# Patient Record
Sex: Female | Born: 1985 | Race: Black or African American | Hispanic: No | Marital: Single | Smoking: Former smoker
Health system: Southern US, Community
[De-identification: ages and names within clinical notes are randomized; demographics above are authoritative.]

## PROBLEM LIST (undated history)

## (undated) DIAGNOSIS — Z8719 Personal history of other diseases of the digestive system: Secondary | ICD-10-CM

## (undated) DIAGNOSIS — N921 Excessive and frequent menstruation with irregular cycle: Secondary | ICD-10-CM

## (undated) DIAGNOSIS — K529 Noninfective gastroenteritis and colitis, unspecified: Secondary | ICD-10-CM

## (undated) DIAGNOSIS — G43909 Migraine, unspecified, not intractable, without status migrainosus: Secondary | ICD-10-CM

## (undated) DIAGNOSIS — F419 Anxiety disorder, unspecified: Secondary | ICD-10-CM

## (undated) DIAGNOSIS — A64 Unspecified sexually transmitted disease: Secondary | ICD-10-CM

## (undated) DIAGNOSIS — N840 Polyp of corpus uteri: Secondary | ICD-10-CM

## (undated) DIAGNOSIS — N870 Mild cervical dysplasia: Secondary | ICD-10-CM

## (undated) HISTORY — PX: COLPOSCOPY: SHX161

## (undated) HISTORY — DX: Mild cervical dysplasia: N87.0

## (undated) HISTORY — DX: Polyp of corpus uteri: N84.0

## (undated) HISTORY — DX: Migraine, unspecified, not intractable, without status migrainosus: G43.909

## (undated) HISTORY — DX: Excessive and frequent menstruation with irregular cycle: N92.1

## (undated) HISTORY — DX: Unspecified sexually transmitted disease: A64

---
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
## 2007-07-26 ENCOUNTER — Ambulatory Visit (HOSPITAL_BASED_OUTPATIENT_CLINIC_OR_DEPARTMENT_OTHER): Admission: RE | Admit: 2007-07-26 | Discharge: 2007-07-26 | Payer: Self-pay | Admitting: Ophthalmology

## 2008-10-20 DIAGNOSIS — Z8711 Personal history of peptic ulcer disease: Secondary | ICD-10-CM

## 2008-10-20 DIAGNOSIS — A64 Unspecified sexually transmitted disease: Secondary | ICD-10-CM

## 2008-10-20 HISTORY — DX: Unspecified sexually transmitted disease: A64

## 2008-10-20 HISTORY — DX: Personal history of peptic ulcer disease: Z87.11

## 2008-12-14 ENCOUNTER — Encounter: Payer: Self-pay | Admitting: Obstetrics and Gynecology

## 2008-12-14 ENCOUNTER — Other Ambulatory Visit: Admission: RE | Admit: 2008-12-14 | Discharge: 2008-12-14 | Payer: Self-pay | Admitting: Obstetrics and Gynecology

## 2008-12-14 ENCOUNTER — Ambulatory Visit: Payer: Self-pay | Admitting: Obstetrics and Gynecology

## 2008-12-14 DIAGNOSIS — N921 Excessive and frequent menstruation with irregular cycle: Secondary | ICD-10-CM

## 2008-12-14 HISTORY — DX: Excessive and frequent menstruation with irregular cycle: N92.1

## 2009-01-01 ENCOUNTER — Ambulatory Visit: Payer: Self-pay | Admitting: Obstetrics and Gynecology

## 2009-03-31 ENCOUNTER — Emergency Department (HOSPITAL_COMMUNITY): Admission: EM | Admit: 2009-03-31 | Discharge: 2009-04-01 | Payer: Self-pay | Admitting: Emergency Medicine

## 2009-08-16 DIAGNOSIS — N870 Mild cervical dysplasia: Secondary | ICD-10-CM

## 2009-08-16 HISTORY — DX: Mild cervical dysplasia: N87.0

## 2009-12-18 ENCOUNTER — Other Ambulatory Visit: Admission: RE | Admit: 2009-12-18 | Discharge: 2009-12-18 | Payer: Self-pay | Admitting: Obstetrics and Gynecology

## 2009-12-18 ENCOUNTER — Ambulatory Visit: Payer: Self-pay | Admitting: Obstetrics and Gynecology

## 2010-03-01 ENCOUNTER — Ambulatory Visit: Payer: Self-pay | Admitting: Women's Health

## 2010-03-29 ENCOUNTER — Ambulatory Visit: Payer: Self-pay | Admitting: Obstetrics and Gynecology

## 2010-04-17 ENCOUNTER — Ambulatory Visit: Payer: Self-pay | Admitting: Gynecology

## 2010-04-28 IMAGING — CR DG ABDOMEN ACUTE W/ 1V CHEST
3 series · 3 of 3 positions shown · non-contrast
Comparison: None

CLINICAL DATA: Abdominal pain

ACUTE ABDOMEN SERIES (ABDOMEN 2 VIEW & CHEST 1 VIEW)

[w chest pa]
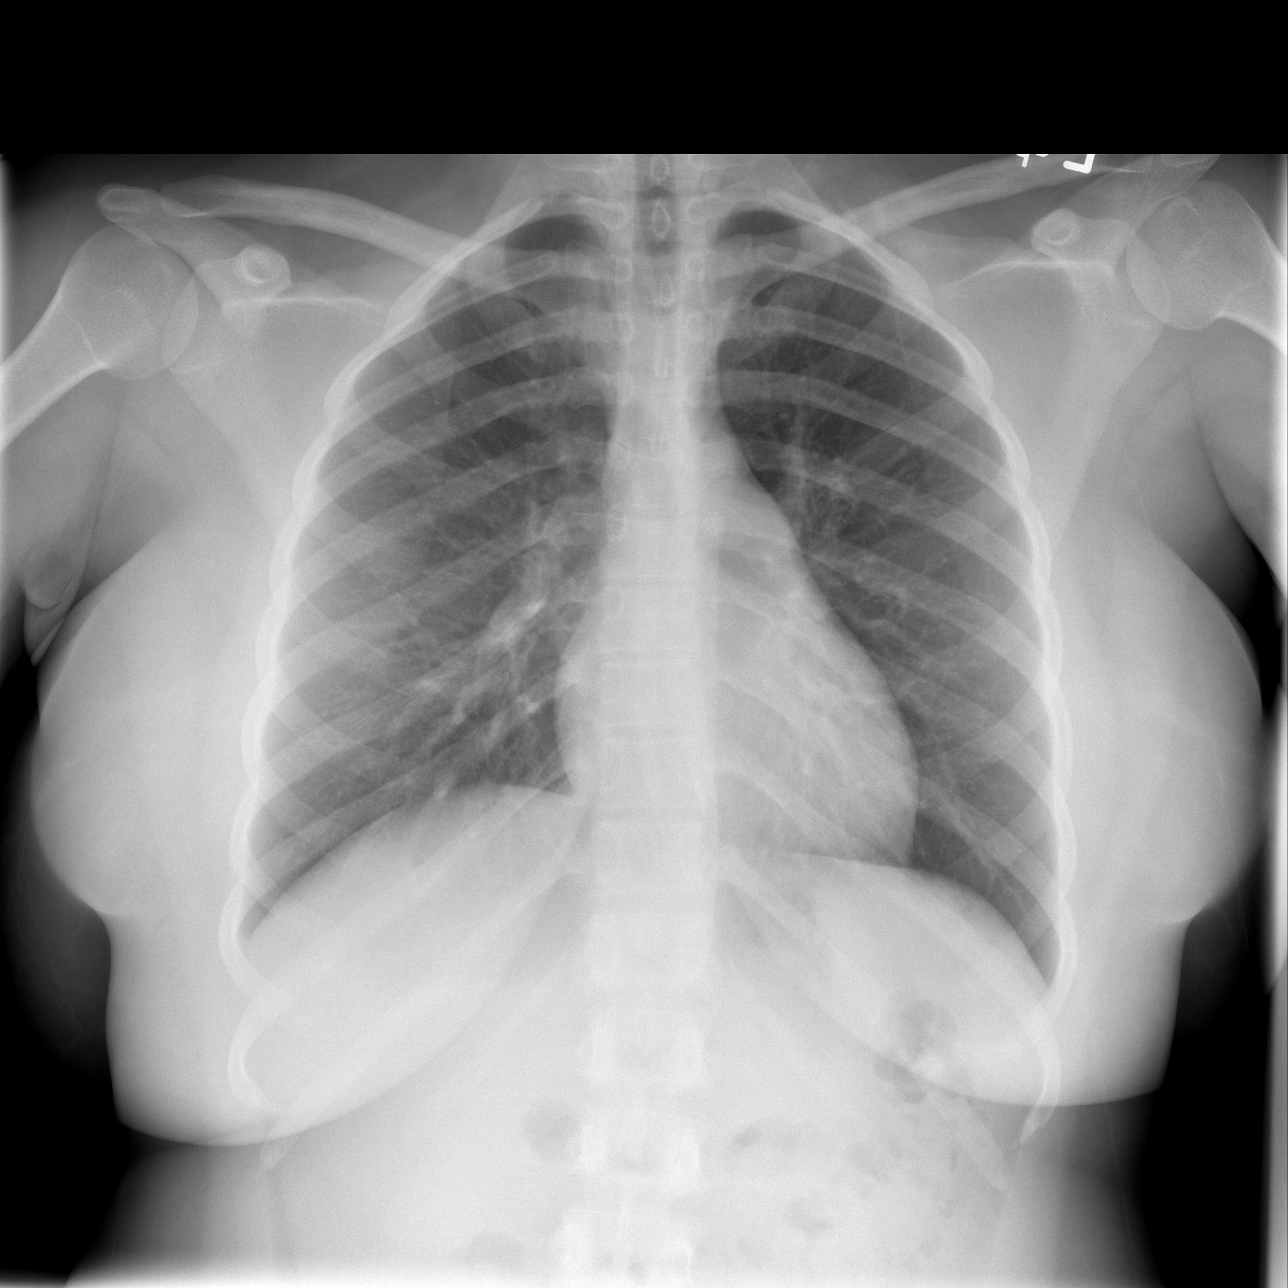

[w abdomen upright]
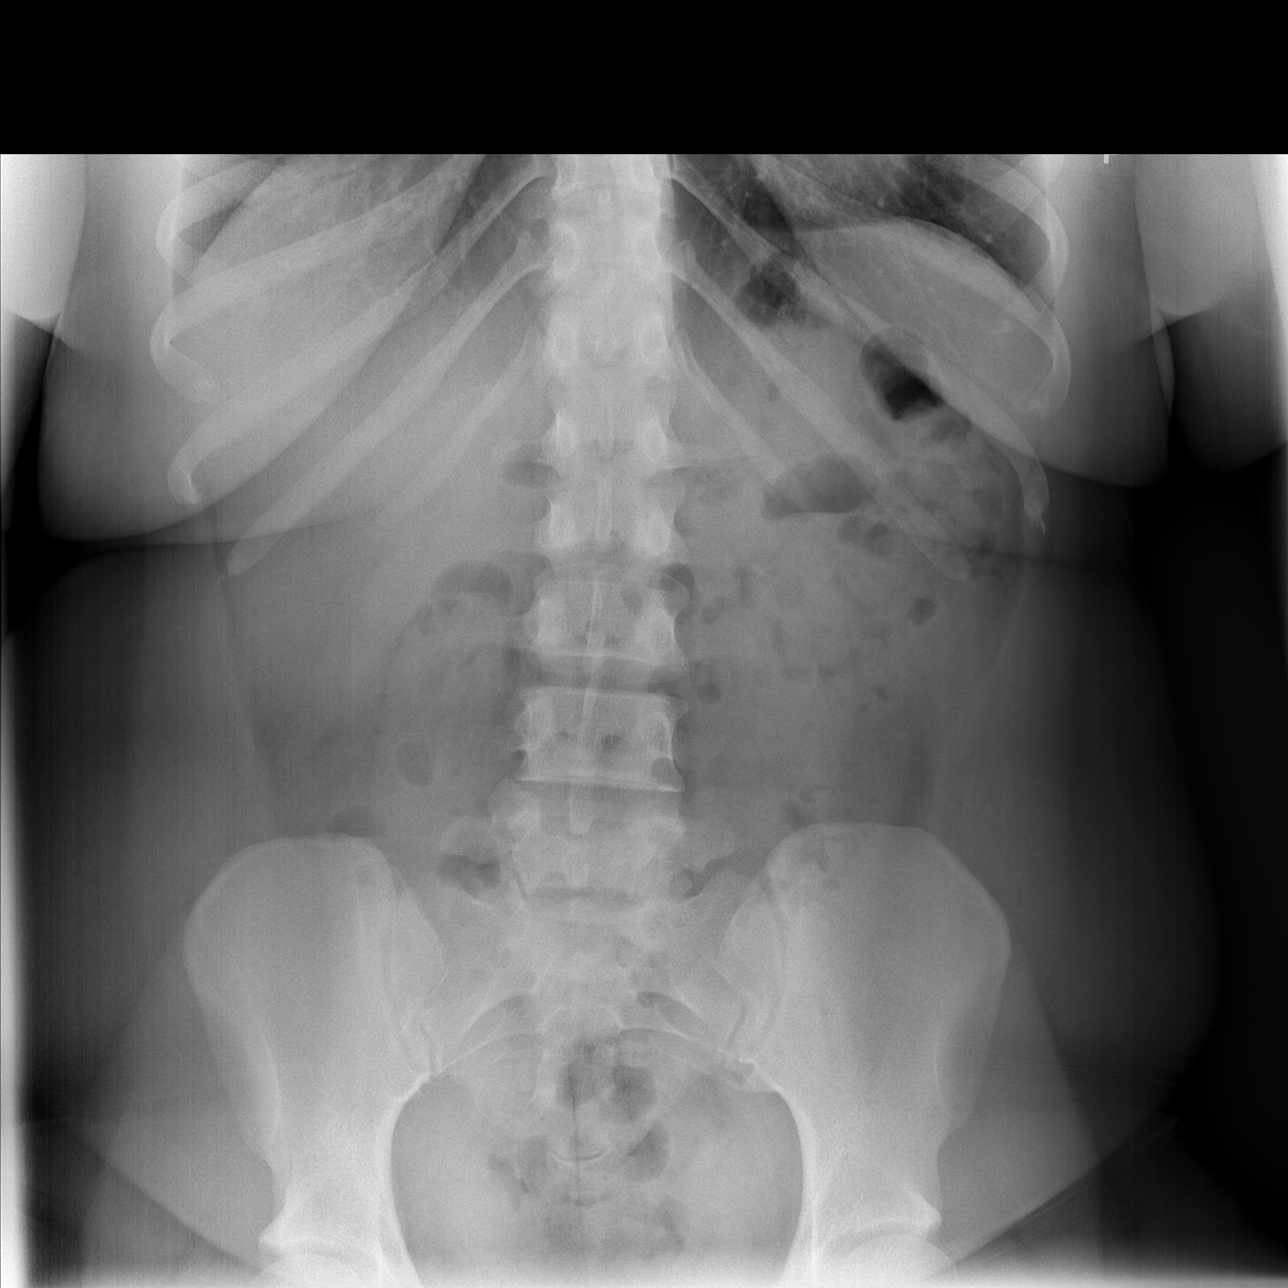

[t abdomen supine]
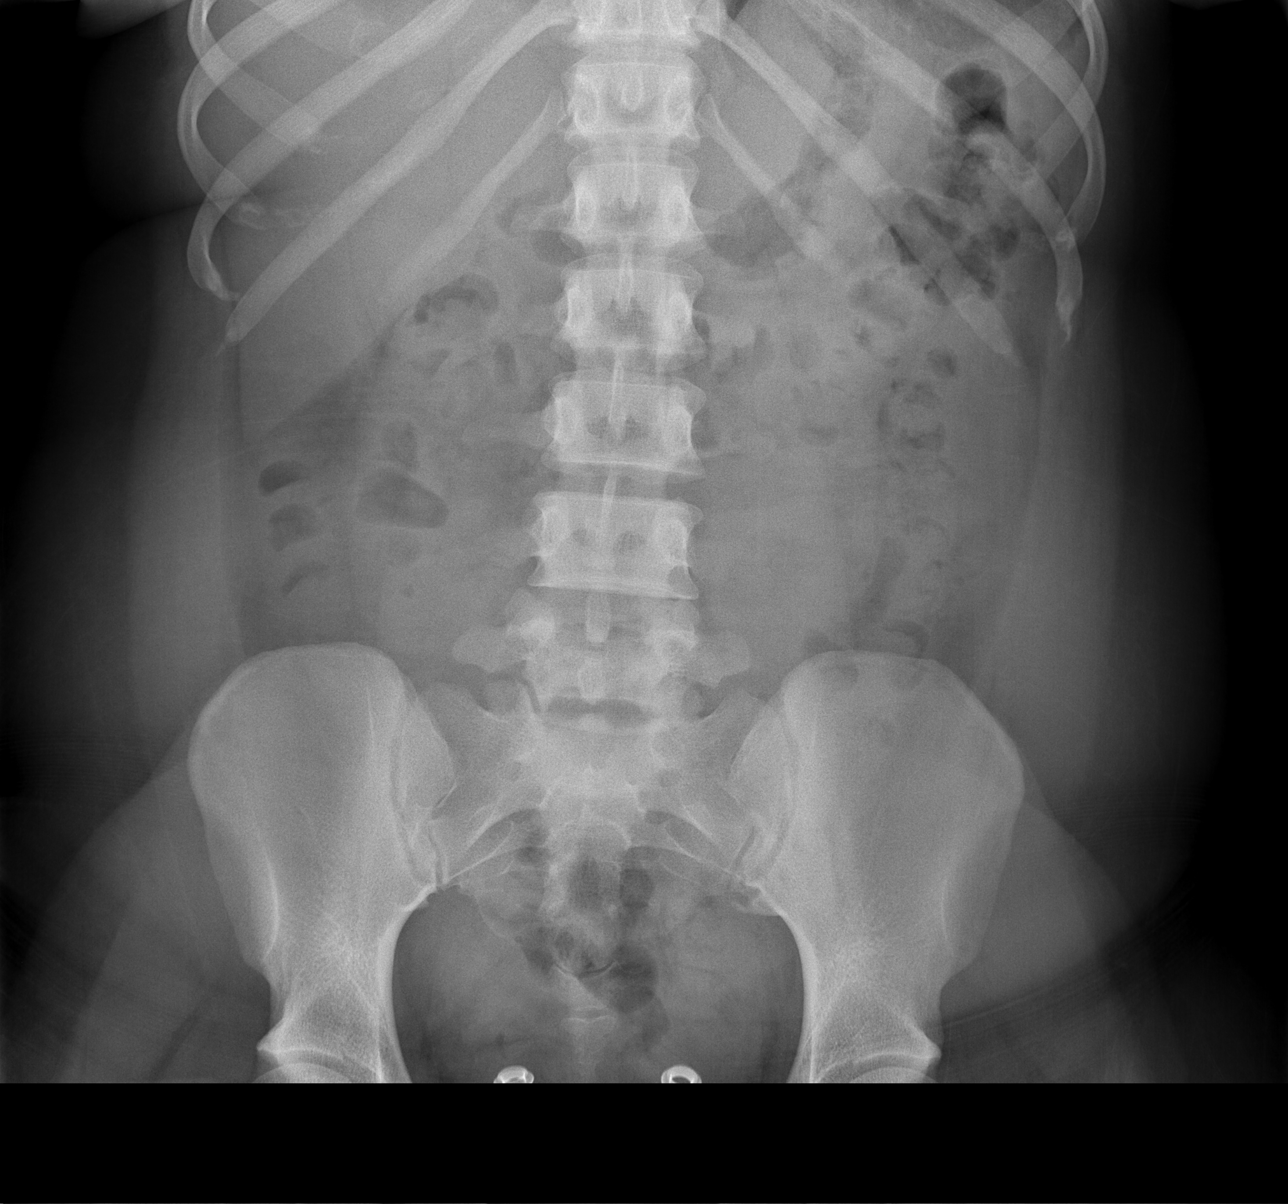

[3 of 3 positions shown; findings below may reference images not displayed]

FINDINGS: The heart size is normal.

There is no pleural effusion or pulmonary edema.

No airspace densities identified.

The bowel gas pattern appears unremarkable.

No dilated loops of small bowel or air-fluid levels noted.
IMPRESSION: 1.  Normal bowel gas pattern.

## 2010-11-06 ENCOUNTER — Emergency Department (HOSPITAL_COMMUNITY)
Admission: EM | Admit: 2010-11-06 | Discharge: 2010-11-06 | Payer: Self-pay | Source: Home / Self Care | Admitting: Emergency Medicine

## 2010-11-11 LAB — CBC
MCHC: 30.3 g/dL (ref 30.0–36.0)
MCV: 71.3 fL — ABNORMAL LOW (ref 78.0–100.0)
Platelets: 323 10*3/uL (ref 150–400)
RDW: 16.5 % — ABNORMAL HIGH (ref 11.5–15.5)
WBC: 12.6 10*3/uL — ABNORMAL HIGH (ref 4.0–10.5)

## 2010-11-11 LAB — COMPREHENSIVE METABOLIC PANEL
Albumin: 3.8 g/dL (ref 3.5–5.2)
Alkaline Phosphatase: 43 U/L (ref 39–117)
BUN: 8 mg/dL (ref 6–23)
Calcium: 9.3 mg/dL (ref 8.4–10.5)
Creatinine, Ser: 0.86 mg/dL (ref 0.4–1.2)
Potassium: 3.9 mEq/L (ref 3.5–5.1)
Total Protein: 7.5 g/dL (ref 6.0–8.3)

## 2010-11-11 LAB — URINALYSIS, ROUTINE W REFLEX MICROSCOPIC
Bilirubin Urine: NEGATIVE
Nitrite: NEGATIVE
Specific Gravity, Urine: 1.027 (ref 1.005–1.030)
pH: 5 (ref 5.0–8.0)

## 2010-11-11 LAB — DIFFERENTIAL
Basophils Relative: 1 % (ref 0–1)
Eosinophils Absolute: 0 10*3/uL (ref 0.0–0.7)
Monocytes Absolute: 0.4 10*3/uL (ref 0.1–1.0)
Neutrophils Relative %: 80 % — ABNORMAL HIGH (ref 43–77)

## 2010-11-11 LAB — URINE MICROSCOPIC-ADD ON

## 2011-01-27 LAB — COMPREHENSIVE METABOLIC PANEL
Alkaline Phosphatase: 53 U/L (ref 39–117)
BUN: 6 mg/dL (ref 6–23)
CO2: 28 mEq/L (ref 19–32)
Chloride: 104 mEq/L (ref 96–112)
Creatinine, Ser: 0.88 mg/dL (ref 0.4–1.2)
GFR calc non Af Amer: >60 mL/min (ref >60)
Glucose, Bld: 122 mg/dL — ABNORMAL HIGH (ref 70–99)
Potassium: 3.6 mEq/L (ref 3.5–5.1)
Total Bilirubin: 0.5 mg/dL (ref 0.3–1.2)

## 2011-01-27 LAB — DIFFERENTIAL
Basophils Absolute: 0 10*3/uL (ref 0.0–0.1)
Basophils Relative: 0 % (ref 0–1)
Lymphocytes Relative: 27 % (ref 12–46)
Neutro Abs: 4.2 10*3/uL (ref 1.7–7.7)

## 2011-01-27 LAB — CBC
HCT: 37.6 % (ref 36.0–46.0)
Hemoglobin: 12.2 g/dL (ref 12.0–15.0)
MCV: 80.2 fL (ref 78.0–100.0)
RBC: 4.68 MIL/uL (ref 3.87–5.11)
WBC: 6.7 10*3/uL (ref 4.0–10.5)

## 2011-01-27 LAB — URINALYSIS, ROUTINE W REFLEX MICROSCOPIC
Bilirubin Urine: NEGATIVE
Hgb urine dipstick: NEGATIVE
Nitrite: NEGATIVE
Protein, ur: NEGATIVE mg/dL
Specific Gravity, Urine: 1.013 (ref 1.005–1.030)
Urobilinogen, UA: 0.2 mg/dL (ref 0.0–1.0)

## 2011-01-27 LAB — LIPASE, BLOOD: Lipase: 32 U/L (ref 11–59)

## 2011-03-07 NOTE — Op Note (Signed)
[name], [name]            ACCOUNT NO.:  [id number]   MEDICAL RECORD NO.:  [id number]          PATIENT TYPE:  AMB   LOCATION:  DSC                          FACILITY:  [facility]   PHYSICIAN:  Salley Scarlet., M.D.DATE OF BIRTH:  01/29/1986   DATE OF PROCEDURE:  07/27/2007  DATE OF DISCHARGE:  07/26/2007                               OPERATIVE REPORT   PREOPERATIVE DIAGNOSIS:  Chalazion upper lid, left eye.   POSTOPERATIVE DIAGNOSIS:  Chalazion upper lid, left eye.   OPERATION:  Chalazion incision.   ANESTHESIA:  Local with Xylocaine 1%.   PROCEDURE:  The upper lid of the left eye was inspected and there was a  soft mass located along the lateral aspect of the upper lid of the left  eye.  The lid was then prepped and draped in the usual manner and then  infiltrated with several cc of Xylocaine 1%.  The patient was then  prepped and draped in the usual manner.  Chalazian clamp was applied  over the lesion and the lid was everted.  A cruciate incision was made  proximal in the lesion and the lesion was curetted using the chalazian  curette.  The sac was incised and sterilized using a sharp and blunt  dissection.  Polysporin ophthalmic ointment and a pressure patch was  applied.  The patient tolerated the procedure well.   Discharged in satisfactory condition with instructions to take Darvocet-  N 100 q.4h. as needed for pain and see me in the office tomorrow for  further evaluation.   DISCHARGE DIAGNOSIS:  Chalazion upper lid left eye      Salley Scarlet., M.D.  Electronically Signed     TB/MEDQ  D:  07/27/2007  T:  07/27/2007  Job:  [id number]

## 2011-04-08 ENCOUNTER — Other Ambulatory Visit: Payer: Self-pay | Admitting: Obstetrics and Gynecology

## 2011-04-08 ENCOUNTER — Other Ambulatory Visit (HOSPITAL_COMMUNITY)
Admission: RE | Admit: 2011-04-08 | Discharge: 2011-04-08 | Disposition: A | Discharge status: Home / Self Care | Payer: Federal, State, Local not specified - PPO | Source: Ambulatory Visit | Attending: Obstetrics and Gynecology | Admitting: Obstetrics and Gynecology

## 2011-04-08 ENCOUNTER — Encounter (INDEPENDENT_AMBULATORY_CARE_PROVIDER_SITE_OTHER): Payer: Federal, State, Local not specified - PPO | Admitting: Obstetrics and Gynecology

## 2011-04-08 DIAGNOSIS — R823 Hemoglobinuria: Secondary | ICD-10-CM

## 2011-04-08 DIAGNOSIS — Z124 Encounter for screening for malignant neoplasm of cervix: Secondary | ICD-10-CM | POA: Insufficient documentation

## 2011-04-08 DIAGNOSIS — N949 Unspecified condition associated with female genital organs and menstrual cycle: Secondary | ICD-10-CM

## 2011-04-08 DIAGNOSIS — Z01419 Encounter for gynecological examination (general) (routine) without abnormal findings: Secondary | ICD-10-CM

## 2011-04-14 ENCOUNTER — Encounter: Payer: Self-pay | Admitting: Obstetrics and Gynecology

## 2011-05-13 ENCOUNTER — Telehealth: Payer: Self-pay | Admitting: *Deleted

## 2011-05-13 NOTE — Telephone Encounter (Signed)
Patient called c/o crying for the past 3 days. She saw ou on 6/19 given Megace to take to stop bleeding. She took it twice a day then increased to three times a day to stop bleeding. Once she stopped Megace she began a period on 05/02/11. She took Clomid 100mg  D5-9 finished that 3 days ago. That's when she has started to cry throughout the day for no apparent reason. She cries all the time. Do you think its hormonal from going from Megace to Clomid? Or is it just a reaction to Clomid? Please advise

## 2011-05-14 NOTE — Telephone Encounter (Signed)
I think her mood swings due to the Clomid not the Megace. She needs to inform os when she starts her period. We then me to see if the Clomid is working. Once solution would be if she's currently to emotional would be to switch her to Femara. A lot of women will not have the same mood swings with Femera that they have with Clomid.

## 2011-05-14 NOTE — Telephone Encounter (Signed)
LM FOR PT TO RETURN MY CALL. KW 4:04 PM 05/14/11

## 2011-05-14 NOTE — Telephone Encounter (Signed)
Patient informed of Dr Timoteo Expose note and patient understood. Will call with or without period. Sherrilyn Rist

## 2011-06-11 ENCOUNTER — Emergency Department (HOSPITAL_COMMUNITY)
Admission: EM | Admit: 2011-06-11 | Discharge: 2011-06-11 | Disposition: A | Discharge status: Home / Self Care | Payer: Federal, State, Local not specified - PPO | Attending: Emergency Medicine | Admitting: Emergency Medicine

## 2011-06-11 DIAGNOSIS — R059 Cough, unspecified: Secondary | ICD-10-CM | POA: Insufficient documentation

## 2011-06-11 DIAGNOSIS — R05 Cough: Secondary | ICD-10-CM | POA: Insufficient documentation

## 2011-08-11 ENCOUNTER — Encounter: Payer: Self-pay | Admitting: Gynecology

## 2011-08-11 ENCOUNTER — Ambulatory Visit (INDEPENDENT_AMBULATORY_CARE_PROVIDER_SITE_OTHER): Payer: Federal, State, Local not specified - PPO | Admitting: Gynecology

## 2011-08-11 VITALS — BP 124/76

## 2011-08-11 DIAGNOSIS — R102 Pelvic and perineal pain: Secondary | ICD-10-CM

## 2011-08-11 DIAGNOSIS — N926 Irregular menstruation, unspecified: Secondary | ICD-10-CM

## 2011-08-11 DIAGNOSIS — N949 Unspecified condition associated with female genital organs and menstrual cycle: Secondary | ICD-10-CM

## 2011-08-11 MED ORDER — MEGESTROL ACETATE 20 MG PO TABS: 20.0000 mg | ORAL_TABLET | Freq: Every day | ORAL | Status: AC

## 2011-08-11 MED ORDER — IBUPROFEN 800 MG PO TABS: 800.0000 mg | ORAL_TABLET | Freq: Three times a day (TID) | ORAL | Status: AC | PRN

## 2011-08-11 NOTE — Progress Notes (Signed)
Patient presents complaining of irregular bleeding and pelvic pain. She is a long history of irregular bleeding attributed to ovulatory dysfunction. She actually was treated with a course of Clomid this past summer per Dr. Eda Paschal but continued to bleed on and off irregularly since then. She had been on oral contraceptives before this but said that this didn't seem to regulate her bleeding. She woke of the last several days with increasing pelvic cramping which led to her office visit today. She had been treated with Megace in the past which stopped her bleeding. She has no history of ultrasound studies both in the chart or per her history.  Exam Spine straight no CVA tenderness Abdomen soft with mild suprapubic tenderness no masses guarding rebound organomegaly Pelvic external BUS vagina with menses type flow, cervix normal, uterus grossly normal in size midline mild tenderness adnexa without masses or tenderness  Assessment and plan: Irregular menses pelvic cramping. Will check a qualitative hCG prolactin TSH. Start on Megace 20 mg twice a day times several days to control bleeding then daily until sonohysterogram rule out nonpalpable abnormalities such as polyps submucous myomas. Ibuprofen 800 mg 3 times a day when necessary pain #30 refill x1.  Follow up for ultrasound and lab work.

## 2011-08-27 ENCOUNTER — Ambulatory Visit: Payer: Federal, State, Local not specified - PPO | Admitting: Gynecology

## 2011-08-27 ENCOUNTER — Other Ambulatory Visit: Payer: Federal, State, Local not specified - PPO

## 2011-10-21 HISTORY — PX: COMBINED HYSTEROSCOPY DIAGNOSTIC / D&C: SUR297

## 2012-07-01 ENCOUNTER — Encounter: Payer: Self-pay | Admitting: Obstetrics and Gynecology

## 2012-07-01 ENCOUNTER — Other Ambulatory Visit (HOSPITAL_COMMUNITY)
Admission: RE | Admit: 2012-07-01 | Discharge: 2012-07-01 | Disposition: A | Discharge status: Home / Self Care | Payer: Federal, State, Local not specified - PPO | Source: Ambulatory Visit | Attending: Obstetrics and Gynecology | Admitting: Obstetrics and Gynecology

## 2012-07-01 ENCOUNTER — Ambulatory Visit (INDEPENDENT_AMBULATORY_CARE_PROVIDER_SITE_OTHER): Payer: Federal, State, Local not specified - PPO | Admitting: Obstetrics and Gynecology

## 2012-07-01 VITALS — BP 124/78 | Ht 63.0 in | Wt 191.0 lb

## 2012-07-01 DIAGNOSIS — J45909 Unspecified asthma, uncomplicated: Secondary | ICD-10-CM | POA: Insufficient documentation

## 2012-07-01 DIAGNOSIS — Z01419 Encounter for gynecological examination (general) (routine) without abnormal findings: Secondary | ICD-10-CM | POA: Insufficient documentation

## 2012-07-01 DIAGNOSIS — N949 Unspecified condition associated with female genital organs and menstrual cycle: Secondary | ICD-10-CM

## 2012-07-01 DIAGNOSIS — N870 Mild cervical dysplasia: Secondary | ICD-10-CM

## 2012-07-01 DIAGNOSIS — G43909 Migraine, unspecified, not intractable, without status migrainosus: Secondary | ICD-10-CM | POA: Insufficient documentation

## 2012-07-01 DIAGNOSIS — N938 Other specified abnormal uterine and vaginal bleeding: Secondary | ICD-10-CM

## 2012-07-01 LAB — CBC WITH DIFFERENTIAL/PLATELET
Basophils Absolute: 0.1 10*3/uL (ref 0.0–0.1)
Eosinophils Relative: 3 % (ref 0–5)
Lymphocytes Relative: 49 % — ABNORMAL HIGH (ref 12–46)
Lymphs Abs: 2.4 10*3/uL (ref 0.7–4.0)
Neutro Abs: 1.9 10*3/uL (ref 1.7–7.7)
Neutrophils Relative %: 39 % — ABNORMAL LOW (ref 43–77)
Platelets: 326 10*3/uL (ref 150–400)
RBC: 4.94 MIL/uL (ref 3.87–5.11)
RDW: 16.7 % — ABNORMAL HIGH (ref 11.5–15.5)
WBC: 4.8 10*3/uL (ref 4.0–10.5)

## 2012-07-01 MED ORDER — MEGESTROL ACETATE 40 MG PO TABS: 40.0000 mg | ORAL_TABLET | Freq: Two times a day (BID) | ORAL | Status: DC

## 2012-07-01 NOTE — Addendum Note (Signed)
Addended by: Dayna Barker on: 07/01/2012 11:36 AM   Modules accepted: Orders

## 2012-07-01 NOTE — Patient Instructions (Signed)
Schedule saline infusion histogram. 

## 2012-07-01 NOTE — Progress Notes (Signed)
Patient came to see me today both for annual exam and problem visit. She continues with metromenorrhagia. She has not done well with birth control pills. She was scheduled for saline infusion histogram but did not keep appointment. She has been anemic with a hemoglobin of 10. Her thyroid and prolactin levels were normal. She is not sexually active at the moment. 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 She had a normal Pap smear after the above in June, 2012. We have previously tried to get her approved for a Mirena IUD. At that time her insurance would not cover it. She has actually been bleeding almost daily since February.  Physical examination:Erika Osborne present. HEENT within normal limits. Neck: Thyroid not large. No masses. Supraclavicular nodes: not enlarged. Breasts: Examined in both sitting and lying  position. No skin changes and no masses. Abdomen: Soft no guarding rebound or masses or hernia. Pelvic: External: Within normal limits. BUS: Within normal limits. Vaginal:within normal limits. Good estrogen effect. No evidence of cystocele rectocele or enterocele. Cervix: clean. Uterus: Normal size and shape. Adnexa: No masses. Rectovaginal exam: Confirmatory and negative. Extremities: Within normal limits.  Assessment: #1. Dysfunctional uterine bleeding #2. CIN-1 #3. Anemia  Plan: CBC drawn. SIH scheduled. Megace 40 mg twice a day until bleeding stops then daily tell we due SIH. We will get her approved for Mirena IUD.

## 2012-07-02 ENCOUNTER — Telehealth: Payer: Self-pay | Admitting: Obstetrics and Gynecology

## 2012-07-02 ENCOUNTER — Other Ambulatory Visit: Payer: Self-pay | Admitting: Obstetrics and Gynecology

## 2012-07-02 DIAGNOSIS — Z3049 Encounter for surveillance of other contraceptives: Secondary | ICD-10-CM

## 2012-07-02 LAB — URINALYSIS W MICROSCOPIC + REFLEX CULTURE
Bacteria, UA: NONE SEEN
Casts: NONE SEEN
Glucose, UA: NEGATIVE mg/dL
Ketones, ur: NEGATIVE mg/dL
Leukocytes, UA: NEGATIVE
Protein, ur: NEGATIVE mg/dL
pH: 5 (ref 5.0–8.0)

## 2012-07-02 MED ORDER — LEVONORGESTREL 20 MCG/24HR IU IUD: INTRAUTERINE_SYSTEM | Freq: Once | INTRAUTERINE | Status: DC

## 2012-07-02 NOTE — Telephone Encounter (Signed)
Message copied by Keenan Bachelor on Fri Jul 02, 2012 10:46 AM ------      Message from: Trellis Paganini      Created: Fri Jul 02, 2012  7:58 AM       Tell patient that she is slightly anemic due to periods. Take ferrous sulfate 325 mg daily ( it is over the counter).

## 2012-07-02 NOTE — Telephone Encounter (Signed)
Pt advised today that Mirena & insertion covered under her$20.00 copay per [employer],not going towards her deductible. We will  Order one for her and she will call to schedule insertion/WL

## 2012-07-02 NOTE — Telephone Encounter (Signed)
I called patient about lab results/recommendation.    Patient wants to have IUD inserted.  However, she said her menses very irregular. She said you gave her RX to stop it for [facility] next week.  She said sometimes she has bleeding for 6 mos and sometimes she goes without menses x 6 mos.  She was told we insert it with her menses and she wondered how she will ever schedule it.  She said she is not sexually active.   Patient knows Dr. Reece Agar is off and I will call her next week.

## 2012-07-03 NOTE — Telephone Encounter (Signed)
After we do shgm we will stop megace. That will start cycle and we can insert IUD. Did we get her  approved?

## 2012-07-05 NOTE — Telephone Encounter (Signed)
We don't actually get them "approved". We have checked her benefits to verify it is covered and she has been notified of the benefit and wants to proceed.  I will let her know the plan.

## 2012-07-05 NOTE — Telephone Encounter (Signed)
Patient informed. 

## 2012-07-08 ENCOUNTER — Ambulatory Visit (INDEPENDENT_AMBULATORY_CARE_PROVIDER_SITE_OTHER): Payer: Federal, State, Local not specified - PPO

## 2012-07-08 ENCOUNTER — Encounter: Payer: Self-pay | Admitting: Obstetrics and Gynecology

## 2012-07-08 ENCOUNTER — Other Ambulatory Visit: Payer: Self-pay | Admitting: Obstetrics and Gynecology

## 2012-07-08 ENCOUNTER — Ambulatory Visit (INDEPENDENT_AMBULATORY_CARE_PROVIDER_SITE_OTHER): Payer: Federal, State, Local not specified - PPO | Admitting: Obstetrics and Gynecology

## 2012-07-08 DIAGNOSIS — N8300 Follicular cyst of ovary, unspecified side: Secondary | ICD-10-CM

## 2012-07-08 DIAGNOSIS — E282 Polycystic ovarian syndrome: Secondary | ICD-10-CM

## 2012-07-08 DIAGNOSIS — N926 Irregular menstruation, unspecified: Secondary | ICD-10-CM

## 2012-07-08 DIAGNOSIS — N921 Excessive and frequent menstruation with irregular cycle: Secondary | ICD-10-CM

## 2012-07-08 DIAGNOSIS — N938 Other specified abnormal uterine and vaginal bleeding: Secondary | ICD-10-CM

## 2012-07-08 DIAGNOSIS — N949 Unspecified condition associated with female genital organs and menstrual cycle: Secondary | ICD-10-CM

## 2012-07-08 DIAGNOSIS — N920 Excessive and frequent menstruation with regular cycle: Secondary | ICD-10-CM

## 2012-07-08 DIAGNOSIS — R102 Pelvic and perineal pain: Secondary | ICD-10-CM

## 2012-07-08 DIAGNOSIS — N840 Polyp of corpus uteri: Secondary | ICD-10-CM

## 2012-07-08 NOTE — Patient Instructions (Signed)
Erika Osborne will call you with dates. Stay on Megace twice a day until surgery.

## 2012-07-08 NOTE — Progress Notes (Signed)
Patient came back to see me today for a saline infusion histogram. On ultrasound she has an anteverted uterus with a prominent endometrial cavity. The endometrial echo is 11.3 mm. Her ovaries were free of neoplasms but she does have a pearl necklace appearance on both sides consistent with polycystic ovaries. Saline was introduced into the endometrial cavity through a catheter and multiple endometrial polyps were identified. There appeared to be approximately 5.  Patient was given the findings including endometrial polyps and polycystic ovaries. She will stay on Megace and we will do a hysteroscopy D&C. We discussed pros and cons of also placing a Jearld Adjutant IUD at the same time. She will decide and inform. She is currently not sexually active. Stay on Megace 40 mg twice a day until surgery.

## 2012-07-08 NOTE — H&P (Signed)
  Chief complaint: Menorrhagia with endometrial polyps. History of present illness: Patient is a 26 year old nulligravida with persistent metromenorrhagia associated with anemia. Saline infusion histogram done in the office showed multiple endometrial polyps. She now enters the hospital for hysteroscopy and excision of endometrial polyps.  Past medical history, family history, social history, review of systems in epic record and reviewed.  Physical examination: HEENT within normal limits. Neck: Thyroid not large. No masses. Supraclavicular nodes: not enlarged. Breasts: Examined in both sitting and lying  position. No skin changes and no masses. Abdomen: Soft no guarding rebound or masses or hernia. Pelvic: External: Within normal limits. BUS: Within normal limits. Vaginal:within normal limits. Good estrogen effect. No evidence of cystocele rectocele or enterocele. Cervix: clean. Uterus: Normal size and shape. Adnexa: No masses. Rectovaginal exam: Confirmatory and negative. Extremities: Within normal limits.  Assessment menometrorrhagia  Plan: Hysteroscopy with excision of endometrial polyps.

## 2012-07-09 ENCOUNTER — Telehealth: Payer: Self-pay | Admitting: Obstetrics and Gynecology

## 2012-07-09 ENCOUNTER — Encounter (HOSPITAL_COMMUNITY): Payer: Self-pay | Admitting: Pharmacist

## 2012-07-09 NOTE — Telephone Encounter (Signed)
I called patient this am to let her know I needed to move her surgery scheduled for 9/27 7:30am to [facility] (from [facility]).  Patient will expect call from [facility] to set up her pre-op appt and provide instructions.  Patient wanted to let me know that she has decided that she does not want Mirena IUD at this time.

## 2012-07-13 NOTE — H&P (Signed)
  Chief complaint: Metromenorrhagia with endometrial polyps  History of present illness: Patient is a 26 year old nulligravida with persistent metromenorrhagia over the past year. She has not responded to oral contraceptives or Megace. Saline infusion histogram showed multiple endometrial polyps. Patient now enters the hospital for hysteroscopy with removal of all endometrial polyps.  Past medical history, family history, social history, and review of systems in epic record and reviewed.  Physical examination: HEENT within normal limits. Neck: Thyroid not large. No masses. Supraclavicular nodes: not enlarged. Breasts: Examined in both sitting and lying  position. No skin changes and no masses. Abdomen: Soft no guarding rebound or masses or hernia. Pelvic: External: Within normal limits. BUS: Within normal limits. Vaginal:within normal limits. Good estrogen effect. No evidence of cystocele rectocele or enterocele. Cervix: clean. Uterus: Normal size and shape. Adnexa: No masses. Rectovaginal exam: Confirmatory and negative. Extremities: Within normal limits.  Assessment: #1. Menometrorrhagia #2. Endometrial polyps  Plan: Hysteroscopy, D&C with removal of endometrial polyps.

## 2012-07-14 ENCOUNTER — Encounter (HOSPITAL_COMMUNITY)
Admission: RE | Admit: 2012-07-14 | Discharge: 2012-07-14 | Disposition: A | Discharge status: Home / Self Care | Payer: Federal, State, Local not specified - PPO | Source: Ambulatory Visit | Attending: Obstetrics and Gynecology | Admitting: Obstetrics and Gynecology

## 2012-07-14 ENCOUNTER — Encounter (HOSPITAL_COMMUNITY): Payer: Self-pay

## 2012-07-14 LAB — CBC
HCT: 36.6 % (ref 36.0–46.0)
Hemoglobin: 11.6 g/dL — ABNORMAL LOW (ref 12.0–15.0)
MCV: 72.6 fL — ABNORMAL LOW (ref 78.0–100.0)
WBC: 5.8 10*3/uL (ref 4.0–10.5)

## 2012-07-14 NOTE — Patient Instructions (Addendum)
   Your procedure is scheduled on: Friday September 27th  Enter through the Main Entrance of [facility] at: [employer] up the phone at the desk and dial [phone] and inform us of your arrival.  Please call this number if you have any problems the morning of surgery: [phone]  Remember: Do not eat or drink anything after midnight on Thursday You may take your topamax morning of surgery with a sip of water and bring your inhalers with you.  Do not wear jewelry, make-up, or FINGER nail polish No metal in your hair or on your body. Do not wear lotions, powders, perfumes. You may wear deodorant.  Please use your CHG wash as directed prior to surgery.  Do not shave anywhere for at least 12 hours prior to first CHG shower.  Do not bring valuables to the hospital. Contacts may not be worn into surgery.    Patients discharged on the day of surgery will not be allowed to drive home.

## 2012-07-16 ENCOUNTER — Encounter (HOSPITAL_COMMUNITY): Payer: Self-pay | Admitting: Anesthesiology

## 2012-07-16 ENCOUNTER — Encounter (HOSPITAL_COMMUNITY): Payer: Self-pay | Admitting: *Deleted

## 2012-07-16 ENCOUNTER — Ambulatory Visit (HOSPITAL_BASED_OUTPATIENT_CLINIC_OR_DEPARTMENT_OTHER)
Admission: RE | Admit: 2012-07-16 | Discharge: 2012-07-16 | Disposition: A | Discharge status: Home / Self Care | Payer: Federal, State, Local not specified - PPO | Source: Ambulatory Visit | Attending: Obstetrics and Gynecology | Admitting: Obstetrics and Gynecology

## 2012-07-16 ENCOUNTER — Ambulatory Visit (HOSPITAL_COMMUNITY): Payer: Federal, State, Local not specified - PPO | Admitting: Anesthesiology

## 2012-07-16 ENCOUNTER — Encounter (HOSPITAL_COMMUNITY)
Admission: RE | Disposition: A | Discharge status: Home / Self Care | Payer: Self-pay | Source: Ambulatory Visit | Attending: Obstetrics and Gynecology

## 2012-07-16 DIAGNOSIS — D250 Submucous leiomyoma of uterus: Secondary | ICD-10-CM | POA: Insufficient documentation

## 2012-07-16 DIAGNOSIS — N920 Excessive and frequent menstruation with regular cycle: Secondary | ICD-10-CM | POA: Insufficient documentation

## 2012-07-16 DIAGNOSIS — Z01812 Encounter for preprocedural laboratory examination: Secondary | ICD-10-CM | POA: Insufficient documentation

## 2012-07-16 DIAGNOSIS — D219 Benign neoplasm of connective and other soft tissue, unspecified: Secondary | ICD-10-CM

## 2012-07-16 DIAGNOSIS — N840 Polyp of corpus uteri: Secondary | ICD-10-CM | POA: Insufficient documentation

## 2012-07-16 DIAGNOSIS — N921 Excessive and frequent menstruation with irregular cycle: Secondary | ICD-10-CM

## 2012-07-16 DIAGNOSIS — Z01818 Encounter for other preprocedural examination: Secondary | ICD-10-CM | POA: Insufficient documentation

## 2012-07-16 SURGERY — Surgical Case
Anesthesia: *Unknown

## 2012-07-16 SURGERY — DILATATION & CURETTAGE/HYSTEROSCOPY WITH RESECTOCOPE
Anesthesia: General | Site: Uterus | Wound class: Clean

## 2012-07-16 MED ORDER — PROPOFOL 10 MG/ML IV BOLUS
INTRAVENOUS | Status: DC | PRN
  Administered 2012-07-16: 200 mg via INTRAVENOUS

## 2012-07-16 MED ORDER — LIDOCAINE HCL (CARDIAC) 20 MG/ML IV SOLN
INTRAVENOUS | Status: AC
  Filled 2012-07-16: qty 5

## 2012-07-16 MED ORDER — ONDANSETRON HCL 4 MG/2ML IJ SOLN
INTRAMUSCULAR | Status: DC | PRN
  Administered 2012-07-16: 4 mg via INTRAVENOUS

## 2012-07-16 MED ORDER — LACTATED RINGERS IV SOLN: 500.0000 mL | INTRAVENOUS | Status: DC

## 2012-07-16 MED ORDER — FENTANYL CITRATE 0.05 MG/ML IJ SOLN
INTRAMUSCULAR | Status: DC | PRN
  Administered 2012-07-16 (×2): 50 ug via INTRAVENOUS

## 2012-07-16 MED ORDER — FENTANYL CITRATE 0.05 MG/ML IJ SOLN
INTRAMUSCULAR | Status: AC
  Filled 2012-07-16: qty 2

## 2012-07-16 MED ORDER — ALBUTEROL SULFATE HFA 108 (90 BASE) MCG/ACT IN AERS
INHALATION_SPRAY | RESPIRATORY_TRACT | Status: DC | PRN
  Administered 2012-07-16: 1 via RESPIRATORY_TRACT

## 2012-07-16 MED ORDER — CEFAZOLIN SODIUM-DEXTROSE 2-3 GM-% IV SOLR
INTRAVENOUS | Status: AC
  Administered 2012-07-16: 2 g via INTRAVENOUS
  Filled 2012-07-16: qty 50

## 2012-07-16 MED ORDER — LIDOCAINE HCL (CARDIAC) 20 MG/ML IV SOLN
INTRAVENOUS | Status: DC | PRN
  Administered 2012-07-16: 80 mg via INTRAVENOUS

## 2012-07-16 MED ORDER — PROPOFOL 10 MG/ML IV EMUL
INTRAVENOUS | Status: AC
  Filled 2012-07-16: qty 20

## 2012-07-16 MED ORDER — ONDANSETRON HCL 4 MG/2ML IJ SOLN: 4.0000 mg | Freq: Once | INTRAMUSCULAR | Status: DC | PRN

## 2012-07-16 MED ORDER — KETOROLAC TROMETHAMINE 30 MG/ML IJ SOLN: 15.0000 mg | Freq: Once | INTRAMUSCULAR | Status: DC | PRN

## 2012-07-16 MED ORDER — KETOROLAC TROMETHAMINE 30 MG/ML IJ SOLN
INTRAMUSCULAR | Status: AC
  Filled 2012-07-16: qty 1

## 2012-07-16 MED ORDER — LACTATED RINGERS IV SOLN
INTRAVENOUS | Status: DC
  Administered 2012-07-16 (×2): via INTRAVENOUS

## 2012-07-16 MED ORDER — KETOROLAC TROMETHAMINE 30 MG/ML IJ SOLN
INTRAMUSCULAR | Status: DC | PRN
  Administered 2012-07-16: 30 mg via INTRAVENOUS

## 2012-07-16 MED ORDER — MEPERIDINE HCL 25 MG/ML IJ SOLN: 6.2500 mg | INTRAMUSCULAR | Status: DC | PRN

## 2012-07-16 MED ORDER — DEXAMETHASONE SODIUM PHOSPHATE 4 MG/ML IJ SOLN
INTRAMUSCULAR | Status: DC | PRN
  Administered 2012-07-16: 10 mg via INTRAVENOUS

## 2012-07-16 MED ORDER — ONDANSETRON HCL 4 MG/2ML IJ SOLN
INTRAMUSCULAR | Status: AC
  Filled 2012-07-16: qty 2

## 2012-07-16 MED ORDER — MIDAZOLAM HCL 5 MG/5ML IJ SOLN
INTRAMUSCULAR | Status: DC | PRN
  Administered 2012-07-16: 2 mg via INTRAVENOUS

## 2012-07-16 MED ORDER — MIDAZOLAM HCL 2 MG/2ML IJ SOLN
INTRAMUSCULAR | Status: AC
  Filled 2012-07-16: qty 2

## 2012-07-16 MED ORDER — DEXAMETHASONE SODIUM PHOSPHATE 10 MG/ML IJ SOLN
INTRAMUSCULAR | Status: AC
  Filled 2012-07-16: qty 1

## 2012-07-16 MED ORDER — HYDROMORPHONE HCL PF 1 MG/ML IJ SOLN: 0.2500 mg | INTRAMUSCULAR | Status: DC | PRN

## 2012-07-16 MED ORDER — GLYCINE 1.5 % IR SOLN
Status: DC | PRN
  Administered 2012-07-16: 3000 mL

## 2012-07-16 MED ORDER — CEFAZOLIN SODIUM-DEXTROSE 2-3 GM-% IV SOLR: 2.0000 g | INTRAVENOUS | Status: DC

## 2012-07-16 SURGICAL SUPPLY — 16 items
CANISTER SUCTION 2500CC (MISCELLANEOUS) ×3 IMPLANT
CLOTH BEACON ORANGE TIMEOUT ST (SAFETY) ×3 IMPLANT
CONTAINER PREFILL 10% NBF 60ML (FORM) ×4 IMPLANT
DRESSING TELFA 8X3 (GAUZE/BANDAGES/DRESSINGS) ×3 IMPLANT
ELECT REM PT RETURN 9FT ADLT (ELECTROSURGICAL) ×3
ELECTRODE REM PT RTRN 9FT ADLT (ELECTROSURGICAL) ×1 IMPLANT
GLOVE BIOGEL PI IND STRL 7.5 (GLOVE) ×4 IMPLANT
GLOVE BIOGEL PI INDICATOR 7.5 (GLOVE) ×2
GLOVE ECLIPSE 7.0 STRL STRAW (GLOVE) ×3 IMPLANT
GOWN STRL REIN XL XLG (GOWN DISPOSABLE) ×7 IMPLANT
LOOP ANGLED CUTTING 22FR (CUTTING LOOP) ×2 IMPLANT
PACK HYSTEROSCOPY LF (CUSTOM PROCEDURE TRAY) ×3 IMPLANT
PAD OB MATERNITY 4.3X12.25 (PERSONAL CARE ITEMS) ×3 IMPLANT
SYR 20CC LL (SYRINGE) ×3 IMPLANT
TOWEL OR 17X24 6PK STRL BLUE (TOWEL DISPOSABLE) ×6 IMPLANT
WATER STERILE IRR 1000ML POUR (IV SOLUTION) ×3 IMPLANT

## 2012-07-16 NOTE — Anesthesia Postprocedure Evaluation (Signed)
Anesthesia Post Note  Patient: Erika Osborne  Procedure(s) Performed: Procedure(s) (LRB): DILATATION & CURETTAGE/HYSTEROSCOPY WITH RESECTOCOPE (N/A)  Anesthesia type: General  Patient location: PACU  Post pain: Pain level controlled  Post assessment: Post-op Vital signs reviewed  Last Vitals:  Filed Vitals:   07/16/12 0915  BP:   Pulse: 72  Temp: 36.6 C  Resp: 16    Post vital signs: Reviewed  Level of consciousness: sedated  Complications: No apparent anesthesia complications

## 2012-07-16 NOTE — Transfer of Care (Signed)
Immediate Anesthesia Transfer of Care Note  Patient: Erika Osborne  Procedure(s) Performed: Procedure(s) (LRB) with comments: DILATATION & CURETTAGE/HYSTEROSCOPY WITH RESECTOCOPE (N/A) - Resection of Endometrial Polyp & Submucosal Fibroid  Patient Location: PACU  Anesthesia Type: General  Level of Consciousness: awake and oriented  Airway & Oxygen Therapy: Patient Spontanous Breathing and Patient connected to nasal cannula oxygen  Post-op Assessment: Report given to PACU RN  Post vital signs: Reviewed and stable  Complications: No apparent anesthesia complications

## 2012-07-16 NOTE — Addendum Note (Signed)
Addendum  created 07/16/12 1429 by Elbert Ewings, CRNA   Modules edited:Charges VN

## 2012-07-16 NOTE — Anesthesia Preprocedure Evaluation (Signed)
Anesthesia Evaluation  Patient identified by MRN, date of birth, ID band Patient awake    Reviewed: Allergy & Precautions, H&P , NPO status , Patient's Chart, lab work & pertinent test results  Airway Mallampati: I TM Distance: >3 FB Neck ROM: full    Dental No notable dental hx. (+) Teeth Intact   Pulmonary asthma ,  breath sounds clear to auscultation  Pulmonary exam normal       Cardiovascular negative cardio ROS      Neuro/Psych negative psych ROS   GI/Hepatic negative GI ROS, Neg liver ROS,   Endo/Other  negative endocrine ROS  Renal/GU negative Renal ROS  negative genitourinary   Musculoskeletal negative musculoskeletal ROS (+)   Abdominal Normal abdominal exam  (+)   Peds negative pediatric ROS (+)  Hematology negative hematology ROS (+)   Anesthesia Other Findings   Reproductive/Obstetrics negative OB ROS                           Anesthesia Physical Anesthesia Plan  ASA: II  Anesthesia Plan: General   Post-op Pain Management:    Induction: Intravenous  Airway Management Planned: LMA  Additional Equipment:   Intra-op Plan:   Post-operative Plan:   Informed Consent: I have reviewed the patients History and Physical, chart, labs and discussed the procedure including the risks, benefits and alternatives for the proposed anesthesia with the patient or authorized representative who has indicated his/her understanding and acceptance.     Plan Discussed with: CRNA and Surgeon  Anesthesia Plan Comments:         Anesthesia Quick Evaluation

## 2012-07-16 NOTE — Op Note (Signed)
Preoperative diagnosis: Menometrorrhagia with endometrial cavity defects Postoperative diagnosis: Metromenorrhagia with both endometrial polyp and submucous myoma Operation: Hysteroscopy with excision of endometrial polyps and myoma Surgeon: Dr. Eda Paschal Anesthesia: Gen. Anesthesia  Findings: External and vaginal exam were within normal limits. Cervix was clean. Uterus was top normal size and shape. Adnexa failed to reveal masses. At the time of hysteroscopy patient had several small endometrial polyps on the lateral walls of the fundus. In addition she had a 2 cm submucous myoma which came off the posterior wall of the fundus in the lower uterine segment. After all tissue was removed top of the fundus, tubal ostia, anterior and posterior walls of the fundus, lower uterine segment, and endocervical canal were now free of all disease.  Procedure: After general anesthesia the patient was placed in the dorsolithotomy position and prepped and draped in the usual sterile manner. A single-tooth tenaculum was placed and anterior lip of the cervix. The cervix was dilated to a #31 Pratt dilator. The hysteroscopic resectoscope was introduced into the cervix. One and 1/2% glycine was used to expand the intrauterine cavity. A camera was used for magnification. A 90 wire-loop with appropriate Bovie settings was used. Both the endometrial polyps and a submucous myoma were resected with the 90 wire-loop. It required multiple passes to completely resect it. In addition a small sponge stick and several size curettes were used to clean out the tissue resected. The specimens were separated into endometrial polyp and submucous myoma. At the termination of the procedure the patient's endometrium cavity was completely clean of all pathology. Blood loss was minimal. Fluid deficit was measured at 450  cc but most of that was on the floor and both the surgeon and the nurses felt the fluid deficit was under 100 cc. The patient  tolerated the procedure well and left the operating room in satisfactory condition. All tissue was sent to pathology for tissue diagnosis.

## 2012-07-16 NOTE — Interval H&P Note (Signed)
History and Physical Interval Note:  07/16/2012 7:12 AM  Erika Osborne  has presented today for surgery, with the diagnosis of Dysfunctional Uterine Bleeding; Endometrial Polyps  The various methods of treatment have been discussed with the patient and family. After consideration of risks, benefits and other options for treatment, the patient has consented to  Procedure(s) (LRB) with comments: DILATATION AND CURETTAGE /HYSTEROSCOPY (N/A) as a surgical intervention .  The patient's history has been reviewed, patient examined, no change in status, stable for surgery.  I have reviewed the patient's chart and labs.  Questions were answered to the patient's satisfaction.     [name]

## 2012-07-16 NOTE — Anesthesia Procedure Notes (Signed)
Procedure Name: LMA Insertion Date/Time: 07/16/2012 7:24 AM Performed by: Maris Berger T Pre-anesthesia Checklist: Patient identified, Emergency Drugs available, Suction available and Patient being monitored Patient Re-evaluated:Patient Re-evaluated prior to inductionOxygen Delivery Method: Circle System Utilized Preoxygenation: Pre-oxygenation with 100% oxygen Intubation Type: IV induction Ventilation: Mask ventilation without difficulty LMA: LMA inserted LMA Size: 4.0 Number of attempts: 1 Placement Confirmation: positive ETCO2 Dental Injury: Teeth and Oropharynx as per pre-operative assessment  Comments: Gauze roll between teeth

## 2012-07-16 NOTE — Addendum Note (Signed)
Addendum  created 07/16/12 1429 by Peja Allender S Payslie Mccaig, CRNA   Modules edited:Charges VN    

## 2012-07-22 ENCOUNTER — Encounter: Payer: Self-pay | Admitting: Obstetrics and Gynecology

## 2012-07-22 ENCOUNTER — Ambulatory Visit (INDEPENDENT_AMBULATORY_CARE_PROVIDER_SITE_OTHER): Payer: Federal, State, Local not specified - PPO | Admitting: Obstetrics and Gynecology

## 2012-07-22 DIAGNOSIS — N840 Polyp of corpus uteri: Secondary | ICD-10-CM | POA: Insufficient documentation

## 2012-07-22 DIAGNOSIS — E282 Polycystic ovarian syndrome: Secondary | ICD-10-CM

## 2012-07-22 DIAGNOSIS — D259 Leiomyoma of uterus, unspecified: Secondary | ICD-10-CM

## 2012-07-22 DIAGNOSIS — D219 Benign neoplasm of connective and other soft tissue, unspecified: Secondary | ICD-10-CM | POA: Insufficient documentation

## 2012-07-22 NOTE — Progress Notes (Signed)
Patient came back today for a postoperative visit. Last week we did hysteroscopic myomectomy and polypectomy. She does have intramural myomas as well. They are small. She also has polycystic ovaries on ultrasound and irregular cycles.  Pelvic exam: External: Within normal limits. BUS within normal limits. Vaginal exam: Within normal limits. Cervix: Clean. Uterus: Within normal limits. Adnexa: Within normal limits. Rectovaginal exam: Within normal limits. Kennon Portela present.  Assessment: #1. Submucous myoma #2. Endometrial polyp #3. Intramural myomas #4. Polycystic ovarian syndrome  Plan: Discussed findings of surgery. Discussed follow up of fibroids. Discussed need for surgery with myomectomy if they grow prior to pregnancy. Suggest when she comes  next year for annual exam she also have ultrasound to follow growth. Discussed polycystic ovarian syndrome in detail. Discussed low carbohydrate diet and insulin resistance. Discussed intervening with a Mirena IUD or oral contraceptives if cycles remain problem. She is currently not sexually active but will use condoms if she becomes active.

## 2012-07-22 NOTE — Patient Instructions (Signed)
Call an update me on menstrual cycles.

## 2012-11-12 ENCOUNTER — Ambulatory Visit (INDEPENDENT_AMBULATORY_CARE_PROVIDER_SITE_OTHER): Payer: Federal, State, Local not specified - PPO | Admitting: Women's Health

## 2012-11-12 ENCOUNTER — Encounter: Payer: Self-pay | Admitting: Women's Health

## 2012-11-12 VITALS — BP 130/60

## 2012-11-12 DIAGNOSIS — N760 Acute vaginitis: Secondary | ICD-10-CM

## 2012-11-12 DIAGNOSIS — A499 Bacterial infection, unspecified: Secondary | ICD-10-CM

## 2012-11-12 DIAGNOSIS — Z113 Encounter for screening for infections with a predominantly sexual mode of transmission: Secondary | ICD-10-CM

## 2012-11-12 DIAGNOSIS — N898 Other specified noninflammatory disorders of vagina: Secondary | ICD-10-CM

## 2012-11-12 LAB — WET PREP FOR TRICH, YEAST, CLUE
Trich, Wet Prep: NONE SEEN
WBC, Wet Prep HPF POC: NONE SEEN

## 2012-11-12 MED ORDER — METRONIDAZOLE 500 MG PO TABS: 500.0000 mg | ORAL_TABLET | Freq: Two times a day (BID) | ORAL | Status: DC

## 2012-11-12 NOTE — Progress Notes (Signed)
Patient ID: Erika Osborne, female   DOB: 10/17/1986, 27 y.o.   MRN: [id number] Presents with complaint of vaginal discharge with odor for 2 weeks, cycle started today. Sexually active with a new partner. History of irregular cycles every 2-3 months/PCOD/fibroids. Denies urinary symptoms, abdominal pain, spotting or fever. Hysteroscopy for uterine polyp and myomectomy September 2013/Dr. Gottsegen.  Exam: Appears well, external genitalia within normal limits, speculum exam moderate amount of menses type blood with odor noted. Wet prep positive for amines, clues, TNTC bacteria. GC/Chlamydia culture taken. Bimanual no CMT or adnexal fullness or tenderness  Bacteria vaginosis STD screen  Plan: HIV, hepatitis B., C., RPR, GC/Chlamydia culture pending. Flagyl 500 twice daily for 7 days, alcohol precautions reviewed. Instructed to call if no relief of discharge. Instructed to use condoms if sexually active.

## 2012-11-12 NOTE — Patient Instructions (Addendum)
Bacterial Vaginosis Bacterial vaginosis (BV) is a vaginal infection where the normal balance of bacteria in the vagina is disrupted. The normal balance is then replaced by an overgrowth of certain bacteria. There are several different kinds of bacteria that can cause BV. BV is the most common vaginal infection in women of childbearing age. CAUSES   The cause of BV is not fully understood. BV develops when there is an increase or imbalance of harmful bacteria.  Some activities or behaviors can upset the normal balance of bacteria in the vagina and put women at increased risk including:  Having a new sex partner or multiple sex partners.  Douching.  Using an intrauterine device (IUD) for contraception.  It is not clear what role sexual activity plays in the development of BV. However, women that have never had sexual intercourse are rarely infected with BV. Women do not get BV from toilet seats, bedding, swimming pools or from touching objects around them.  SYMPTOMS   Grey vaginal discharge.  A fish-like odor with discharge, especially after sexual intercourse.  Itching or burning of the vagina and vulva.  Burning or pain with urination.  Some women have no signs or symptoms at all. DIAGNOSIS  Your caregiver must examine the vagina for signs of BV. Your caregiver will perform lab tests and look at the sample of vaginal fluid through a microscope. They will look for bacteria and abnormal cells (clue cells), a pH test higher than 4.5, and a positive amine test all associated with BV.  RISKS AND COMPLICATIONS   Pelvic inflammatory disease (PID).  Infections following gynecology surgery.  Developing HIV.  Developing herpes virus. TREATMENT  Sometimes BV will clear up without treatment. However, all women with symptoms of BV should be treated to avoid complications, especially if gynecology surgery is planned. Female partners generally do not need to be treated. However, BV may spread  between female sex partners so treatment is helpful in preventing a recurrence of BV.   BV may be treated with antibiotics. The antibiotics come in either pill or vaginal cream forms. Either can be used with nonpregnant or pregnant women, but the recommended dosages differ. These antibiotics are not harmful to the baby.  BV can recur after treatment. If this happens, a second round of antibiotics will often be prescribed.  Treatment is important for pregnant women. If not treated, BV can cause a premature delivery, especially for a pregnant woman who had a premature birth in the past. All pregnant women who have symptoms of BV should be checked and treated.  For chronic reoccurrence of BV, treatment with a type of prescribed gel vaginally twice a week is helpful. HOME CARE INSTRUCTIONS   Finish all medication as directed by your caregiver.  Do not have sex until treatment is completed.  Tell your sexual partner that you have a vaginal infection. They should see their caregiver and be treated if they have problems, such as a mild rash or itching.  Practice safe sex. Use condoms. Only have 1 sex partner. PREVENTION  Basic prevention steps can help reduce the risk of upsetting the natural balance of bacteria in the vagina and developing BV:  Do not have sexual intercourse (be abstinent).  Do not douche.  Use all of the medicine prescribed for treatment of BV, even if the signs and symptoms go away.  Tell your sex partner if you have BV. That way, they can be treated, if needed, to prevent reoccurrence. SEEK MEDICAL CARE IF:     Your symptoms are not improving after 3 days of treatment.  You have increased discharge, pain, or fever. MAKE SURE YOU:   Understand these instructions.  Will watch your condition.  Will get help right away if you are not doing well or get worse. FOR MORE INFORMATION  Division of STD Prevention (DSTDP), Centers for Disease Control and Prevention:  www.cdc.gov/std [employer] (ASHA): www.ashastd.org  Document Released: 10/06/2005 Document Revised: 12/29/2011 Document Reviewed: 03/29/2009 [employer] Patient Information 2013 [employer]  

## 2012-11-13 LAB — HEPATITIS B SURFACE ANTIGEN: Hepatitis B Surface Ag: NEGATIVE

## 2012-11-13 LAB — GC/CHLAMYDIA PROBE AMP: GC Probe RNA: NEGATIVE

## 2012-11-13 LAB — RPR

## 2013-05-13 ENCOUNTER — Encounter: Payer: Self-pay | Admitting: Gynecology

## 2013-05-13 ENCOUNTER — Ambulatory Visit (INDEPENDENT_AMBULATORY_CARE_PROVIDER_SITE_OTHER): Payer: Federal, State, Local not specified - PPO | Admitting: Gynecology

## 2013-05-13 VITALS — BP 126/88

## 2013-05-13 DIAGNOSIS — Z113 Encounter for screening for infections with a predominantly sexual mode of transmission: Secondary | ICD-10-CM

## 2013-05-13 DIAGNOSIS — Z8742 Personal history of other diseases of the female genital tract: Secondary | ICD-10-CM

## 2013-05-13 DIAGNOSIS — N898 Other specified noninflammatory disorders of vagina: Secondary | ICD-10-CM

## 2013-05-13 DIAGNOSIS — N979 Female infertility, unspecified: Secondary | ICD-10-CM

## 2013-05-13 DIAGNOSIS — N915 Oligomenorrhea, unspecified: Secondary | ICD-10-CM

## 2013-05-13 DIAGNOSIS — D259 Leiomyoma of uterus, unspecified: Secondary | ICD-10-CM

## 2013-05-13 LAB — PREGNANCY, URINE: Preg Test, Ur: NEGATIVE

## 2013-05-13 LAB — WET PREP FOR TRICH, YEAST, CLUE
Clue Cells Wet Prep HPF POC: NONE SEEN
WBC, Wet Prep HPF POC: NONE SEEN

## 2013-05-13 MED ORDER — MEDROXYPROGESTERONE ACETATE 10 MG PO TABS: 10.0000 mg | ORAL_TABLET | Freq: Every day | ORAL | Status: DC

## 2013-05-13 NOTE — Patient Instructions (Addendum)
Transvaginal Ultrasound Transvaginal ultrasound is a pelvic ultrasound, using a metal probe that is placed in the vagina, to look at a women's female organs. Transvaginal ultrasound is a method of seeing inside the pelvis of a woman. The ultrasound machine sends out sound waves from the transducer (probe). These sound waves bounce off body structures (like an echo) to create a picture. The picture shows up on a monitor. It is called transvaginal because the probe is inserted into the vagina. There should be very little discomfort from the vaginal probe. This test can also be used during pregnancy. Endovaginal ultrasound is another name for a transvaginal ultrasound. In a transabdominal ultrasound, the probe is placed on the outside of the belly. This method gives pictures that are lower quality than pictures from the transvaginal technique. Transvaginal ultrasound is used to look for problems of the female genital tract.   Infertility WHAT IS INFERTILITY?  Infertility is usually defined as not being able to get pregnant after trying for one year of regular sexual intercourse without the use of contraceptives. Or not being able to carry a pregnancy to term and have a baby. The infertility rate in the [place] is around 10%. Pregnancy is the result of a chain of events. A woman must release an egg from one of her ovaries (ovulation). The egg must be fertilized by the female sperm. Then it travels through a fallopian tube into the uterus (womb), where it attaches to the wall of the uterus and grows. A man must have enough sperm, and the sperm must join with (fertilize) the egg along the way, at the proper time. The fertilized egg must then become attached to the inside of the uterus. While this may seem simple, many things can happen to prevent pregnancy from occurring.  WHOSE PROBLEM IS IT?  About 20% of infertility cases are due to problems with the man (female factors) and 65% are due to problems with the  woman (female factors). Other cases are due to a combination of female and female factors or to unknown causes.  WHAT CAUSES INFERTILITY IN MEN?  Infertility in men is often caused by problems with making enough normal sperm or getting the sperm to reach the egg. Problems with sperm may exist from birth or develop later in life, due to illness or injury. Some men produce no sperm, or produce too few sperm (oligospermia). Other problems include:  Sexual dysfunction.  Hormonal or endocrine problems.  Age. Female fertility decreases with age, but not at as young an age as female fertility.  Infection.  Congenital problems. Birth defect, such as absence of the tubes that carry the sperm (vas deferens).  Genetic/chromosomal problems.  Antisperm antibody problems.  Retrograde ejaculation (sperm go into the bladder).  Varicoceles, spematoceles, or tumors of the testicles.  Lifestyle can influence the number and quality of a man's sperm.  Alcohol and drugs can temporarily reduce sperm quality.  Environmental toxins, including pesticides and lead, may cause some cases of infertility in men. WHAT CAUSES INFERTILITY IN WOMEN?   Problems with ovulation account for most infertility in women. Without ovulation, eggs are not available to be fertilized.  Signs of problems with ovulation include irregular menstrual periods or no periods at all.  Simple lifestyle factors, including stress, diet, or athletic training, can affect a woman's hormonal balance.  Age. Fertility begins to decrease in women in the early 28s and is worse after age 56.  Much less often, a hormonal imbalance from a  serious medical problem, such as a pituitary gland tumor, thyroid or other chronic medical disease, can cause ovulation problems.  Pelvic infections.  Polycystic ovary syndrome (increase in female hormones, unable to ovulate).  Alcohol or illegal drugs.  Environmental toxins, radiation, pesticides, and certain  chemicals.  Aging is an important factor in female infertility.  The ability of a woman's ovaries to produce eggs declines with age, especially after age 75. About one third of couples where the woman is over 35 will have problems with fertility.  By the time she reaches menopause when her monthly periods stop for good, a woman can no longer produce eggs or become pregnant.  Other problems can also lead to infertility in women. If the fallopian tubes are blocked at one or both ends, the egg cannot travel through the tubes into the uterus. Scar tissue (adhesions) in the pelvis may cause blocked tubes. This may result from pelvic inflammatory disease, endometriosis, or surgery for an ectopic pregnancy (fertilized egg implanted outside the uterus) or any pelvic or abdominal surgery causing adhesions.  Fibroid tumors or polyps of the uterus.  Congenital (birth defect) abnormalities of the uterus.  Infection of the cervix (cervicitis).  Cervical stenosis (narrowing).  Abnormal cervical mucus.  Polycystic ovary syndrome.  Having sexual intercourse too often (every other day or 4 to 5 times a week).  Obesity.  Anorexia.  Poor nutrition.  Over exercising, with loss of body fat.  DES. Your mother received diethylstilbesterol hormone when pregnant with you. HOW IS INFERTILITY TESTED?  If you have been trying to have a baby without success, you may want to seek medical help. You should not wait for one year of trying before seeing a health care provider if:  You are over 35.  You have reason to believe that there may be a fertility problem. A medical evaluation may determine the reasons for a couple's infertility. Usually this process begins with:  Physical exams.  Medical histories of both partners.  Sexual histories of both partners. If there is no obvious problem, like improperly timed intercourse or absence of ovulation, tests may be needed.   For a man, testing usually  begins with tests of his semen to look at:  The number of sperm.  The shape of sperm.  Movement of his sperm.  Taking a complete medical and surgical history.  Physical examination.  Check for infection of the female reproductive organs. Sometimes hormone tests are done.   For a woman, the first step in testing is to find out if she is ovulating each month. There are several ways to do this. For example, she can keep track of changes in her morning body temperature and in the texture of her cervical mucus. Another tool is a home ovulation test kit, which can be bought at drug or grocery stores.  Checks of ovulation can also be done in the doctor's office, using blood tests for hormone levels or ultrasound tests of the ovaries. If the woman is ovulating, more tests will need to be done. Some common female tests include:  Hysterosalpingogram: An x-ray of the fallopian tubes and uterus after they are injected with dye. It shows if the tubes are open and shows the shape of the uterus.  Laparoscopy: An exam of the tubes and other female organs for disease. A lighted tube called a laparoscope is used to see inside the abdomen.  Endometrial biopsy: Sample of uterus tissue taken on the first day of the menstrual period, to  see if the tissue indicates you are ovulating.  Transvaginal ultrasound: Examines the female organs.  Hysteroscopy: Uses a lighted tube to examine the cervix and inside the uterus, to see if there are any abnormalities inside the uterus. TREATMENT  Depending on the test results, different treatments can be suggested. The type of treatment depends on the cause. 85 to 90% of infertility cases are treated with drugs or surgery.   Various fertility drugs may be used for women with ovulation problems. It is important to talk with your caregiver about the drug to be used. You should understand the drug's benefits and side effects. Depending on the type of fertility drug and the  dosage of the drug used, multiple births (twins or multiples) can occur in some women.  If needed, surgery can be done to repair damage to a woman's ovaries, fallopian tubes, cervix, or uterus.  Surgery or medical treatment for endometriosis or polycystic ovary syndrome. Sometimes a man has an infertility problem that can be corrected with medicine or by surgery.  Intrauterine insemination (IUI) of sperm, timed with ovulation.  Change in lifestyle, if that is the cause (lose weight, increase exercise, and stop smoking, drinking excessively, or taking illegal drugs).  Other types of surgery:  Removing growths inside and on the uterus.  Removing scar tissue from inside of the uterus.  Fixing blocked tubes.  Removing scar tissue in the pelvis and around the female organs. WHAT IS ASSISTED REPRODUCTIVE TECHNOLOGY (ART)?  Assisted reproductive technology (ART) is another form of special methods used to help infertile couples. ART involves handling both the woman's eggs and the man's sperm. Success rates vary and depend on many factors. ART can be expensive and time-consuming. But ART has made it possible for many couples to have children that otherwise would not have been conceived. Some methods are listed below:  In vitro fertilization (IVF). IVF is often used when a woman's fallopian tubes are blocked or when a man has low sperm counts. A drug is used to stimulate the ovaries to produce multiple eggs. Once mature, the eggs are removed and placed in a culture dish with the man's sperm for fertilization. After about 40 hours, the eggs are examined to see if they have become fertilized by the sperm and are dividing into cells. These fertilized eggs (embryos) are then placed in the woman's uterus. This bypasses the fallopian tubes.  Gamete intrafallopian transfer (GIFT) is similar to IVF, but used when the woman has at least one normal fallopian tube. Three to five eggs are placed in the fallopian  tube, along with the man's sperm, for fertilization inside the woman's body.  Zygote intrafallopian transfer (ZIFT), also called tubal embryo transfer, combines IVF and GIFT. The eggs retrieved from the woman's ovaries are fertilized in the lab and placed in the fallopian tubes rather than in the uterus.  ART procedures sometimes involve the use of donor eggs (eggs from another woman) or previously frozen embryos. Donor eggs may be used if a woman has impaired ovaries or has a genetic disease that could be passed on to her baby.  When performing ART, you are at higher risk for resulting in multiple pregnancies, twins, triplets or more.  Intracytoplasma sperm injection is a procedure that injects a single sperm into the egg to fertilize it.  Embryo transplant is a procedure that starts after growing an embryo in a special media (chemical solution) developed to keep the embryo alive for 2 to 5 days, and then  transplanting it into the uterus. In cases where a cause cannot be found and pregnancy does not occur, adoption may be a consideration. Document Released: 10/09/2003 Document Revised: 12/29/2011 Document Reviewed: 09/04/2009 [facility] Patient Information 2014 [place], [place]. Polycystic Ovarian Syndrome Polycystic ovarian syndrome is a condition with a number of problems. One problem is with the ovaries. The ovaries are organs located in the female pelvis, on each side of the uterus. Usually, during the menstrual cycle, an egg is released from 1 ovary every month. This is called ovulation. When the egg is fertilized, it goes into the womb (uterus), which allows for the growth of a baby. The egg travels from the ovary through the fallopian tube to the uterus. The ovaries also make the hormones estrogen and progesterone. These hormones help the development of a woman's breasts, body shape, and body hair. They also regulate the menstrual cycle and pregnancy. Sometimes, cysts form in the ovaries. A cyst  is a fluid-filled sac. On the ovary, different types of cysts can form. The most common type of ovarian cyst is called a functional or ovulation cyst. It is normal, and often forms during the normal menstrual cycle. Each month, a woman's ovaries grow tiny cysts that hold the eggs. When an egg is fully grown, the sac breaks open. This releases the egg. Then, the sac which released the egg from the ovary dissolves. In one type of functional cyst, called a follicle cyst, the sac does not break open to release the egg. It may actually continue to grow. This type of cyst usually disappears within 1 to 3 months.  One type of cyst problem with the ovaries is called Polycystic Ovarian Syndrome (PCOS). In this condition, many follicle cysts form, but do not rupture and produce an egg. This health problem can affect the following:  Menstrual cycle.  Heart.  Obesity.  Cancer of the uterus.  Fertility.  Blood vessels.  Hair growth (face and body) or baldness.  Hormones.  Appearance.  High blood pressure.  Stroke.  Insulin production.  Inflammation of the liver.  Elevated blood cholesterol and triglycerides. CAUSES   No one knows the exact cause of PCOS.  Women with PCOS often have a mother or sister with PCOS. There is not yet enough proof to say this is inherited.  Many women with PCOS have a weight problem.  Researchers are looking at the relationship between PCOS and the body's ability to make insulin. Insulin is a hormone that regulates the change of sugar, starches, and other food into energy for the body's use, or for storage. Some women with PCOS make too much insulin. It is possible that the ovaries react by making too many female hormones, called androgens. This can lead to acne, excessive hair growth, weight gain, and ovulation problems.  Too much production of luteinizing hormone (LH) from the pituitary gland in the brain stimulates the ovary to produce too much female hormone  (androgen). SYMPTOMS   Infrequent or no menstrual periods, and/or irregular bleeding.  Inability to get pregnant (infertility), because of not ovulating.  Increased growth of hair on the face, chest, stomach, back, thumbs, thighs, or toes.  Acne, oily skin, or dandruff.  Pelvic pain.  Weight gain or obesity, usually carrying extra weight around the waist.  Type 2 diabetes (this is the diabetes that usually does not need insulin).  High cholesterol.  High blood pressure.  Female-pattern baldness or thinning hair.  Patches of thickened and dark brown or black skin on  the neck, arms, breasts, or thighs.  Skin tags, or tiny excess flaps of skin, in the armpits or neck area.  Sleep apnea (excessive snoring and breathing stops at times while asleep).  Deepening of the voice.  Gestational diabetes when pregnant.  Increased risk of miscarriage with pregnancy. DIAGNOSIS  There is no single test to diagnose PCOS.   Your caregiver will:  Take a medical history.  Perform a pelvic exam.  Perform an ultrasound.  Check your female and female hormone levels.  Measure glucose or sugar levels in the blood.  Do other blood tests.  If you are producing too many female hormones, your caregiver will make sure it is from PCOS. At the physical exam, your caregiver will want to evaluate the areas of increased hair growth. Try to allow natural hair growth for a few days before the visit.  During a pelvic exam, the ovaries may be enlarged or swollen by the increased number of small cysts. This can be seen more easily by vaginal ultrasound or screening, to examine the ovaries and lining of the uterus (endometrium) for cysts. The uterine lining may become thicker, if there has not been a regular period. TREATMENT  Because there is no cure for PCOS, it needs to be managed to prevent problems. Treatments are based on your symptoms. Treatment is also based on whether you want to have a baby or  whether you need contraception.  Treatment may include:  Progesterone hormone, to start a menstrual period.  Birth control pills, to make you have regular menstrual periods.  Medicines to make you ovulate, if you want to get pregnant.  Medicines to control your insulin.  Medicine to control your blood pressure.  Medicine and diet, to control your high cholesterol and triglycerides in your blood.  Surgery, making small holes in the ovary, to decrease the amount of female hormone production. This is done through a long, lighted tube (laparoscope), placed into the pelvis through a tiny incision in the lower abdomen. Your caregiver will go over some of the choices with you. WOMEN WITH PCOS HAVE THESE CHARACTERISTICS:  High levels of female hormones called androgens.  An irregular or no menstrual cycle.  May have many small cysts in their ovaries. PCOS is the most common hormonal reproductive problem in women of childbearing age. WHY DO WOMEN WITH PCOS HAVE TROUBLE WITH THEIR MENSTRUAL CYCLE? Each month, about 20 eggs start to mature in the ovaries. As one egg grows and matures, the follicle breaks open to release the egg, so it can travel through the fallopian tube for fertilization. When the single egg leaves the follicle, ovulation takes place. In women with PCOS, the ovary does not make all of the hormones it needs for any of the eggs to fully mature. They may start to grow and accumulate fluid, but no one egg becomes large enough. Instead, some may remain as cysts. Since no egg matures or is released, ovulation does not occur and the hormone progesterone is not made. Without progesterone, a woman's menstrual cycle is irregular or absent. Also, the cysts produce female hormones, which continue to prevent ovulation.  Document Released: 01/30/2005 Document Revised: 12/29/2011 Document Reviewed: 08/24/2009 [facility] Patient Information 2014 [place], [place]. Some such problems  include:  Infertility problems.  Congenital (birth defect) malformations of the uterus and ovaries.  Tumors in the uterus.  Abnormal bleeding.  Ovarian tumors and cysts.  Abscess (inflamed tissue around pus) in the pelvis.  Unexplained abdominal or pelvic pain.  Pelvic infection. DURING PREGNANCY, TRANSVAGINAL ULTRASOUND MAY BE USED TO LOOK AT:  Normal pregnancy.  Ectopic pregnancy (pregnancy outside the uterus).  Fetal heartbeat.  Abnormalities in the pelvis, that are not seen well with transabdominal ultrasound.  Suspected twins or multiples.  Impending miscarriage.  Problems with the cervix (incompetent cervix, not able to stay closed and hold the baby).  When doing an amniocentesis (removing fluid from the pregnancy sac, for testing).  Looking for abnormalities of the baby.  Checking the growth, development, and age of the fetus.  Measuring the amount of fluid in the amniotic sac.  When doing an external version of the baby (moving baby into correct position).  Evaluating the baby for problems in high risk pregnancies (biophysical profile).  Suspected fetal demise (death). Sometimes a special ultrasound method called Saline Infusion Sonography (SIS) is used for a more accurate look at the uterus. Sterile saline (salt water) is injected into the uterus of non-pregnant patients to see the inside of the uterus better. SIS is not used on pregnant women. The vaginal probe can also assist in obtaining biopsies of abnormal areas, in draining fluid from cysts on the ovary, and in finding IUDs (intrauterine device, birth control) that cannot be located. PREPARATION FOR TEST A transvaginal ultrasound is done with the bladder empty. The transabdominal ultrasound is done with your bladder full. You may be asked to drink several glasses of water before that exam. Sometimes, a transabdominal ultrasound is done just after a transvaginal ultrasound, to look at organs in your  abdomen. PROCEDURE  You will lie down on a table, with your knees bent and your feet in foot holders. The probe is covered with a condom. A sterile lubricant is put into the vagina and on the probe. The lubricant helps transmit the sound waves and avoid irritating the vagina. Your caregiver will move the probe inside the vaginal cavity to scan the pelvic structures. A normal test will show a normal pelvis and normal contents. An abnormal test will show abnormalities of the pelvis, placenta, or baby. ABNORMAL RESULTS MAY BE DUE TO:  Growths or tumors in the:  Uterus.  Ovaries.  Vagina.  Other pelvic structures.  Non-cancerous growths of the uterus and ovaries.  Twisting of the ovary, cutting off blood supply to the ovary (ovarian torsion).  Areas of infection, including:  Pelvic inflammatory disease.  Abscess in the pelvis.  Locating an IUD. PROBLEMS FOUND IN PREGNANT WOMEN MAY INCLUDE:  Ectopic pregnancy (pregnancy outside the uterus).  Multiple pregnancies.  Early dilation (opening) of the cervix. This may indicate an incompetent cervix and early delivery.  Impending miscarriage.  Fetal death.  Problems with the placenta, including:  Placenta has grown over the opening of the womb (placenta previa).  Placenta has separated early in the womb (placental abruption).  Placenta grows into the muscle of the uterus (placenta accreta).  Tumors of pregnancy, including gestational trophoblastic disease. This is an abnormal pregnancy, with no fetus. The uterus is filled with many grape-like cysts that could sometimes be cancerous.  Incorrect position of the fetus (breech, vertex).  Intrauterine fetal growth retardation (IUGR) (poor growth in the womb).  Fetal abnormalities or infection. RISKS AND COMPLICATIONS There are no known risks to the ultrasound procedure. There is no X-ray used when doing an ultrasound. Document Released: 09/17/2004 Document Revised: 12/29/2011  Document Reviewed: 09/05/2009 [facility] Patient Information 2014 [place], [place].

## 2013-05-13 NOTE — Progress Notes (Signed)
Patient presented to the office today with a complaint of a slight vaginal discharge. She also has a past history of oligomenorrhea and PCO S. Patient has been with her current partner for 9 months and has been wishing to conceive. Last year patient had resectoscopic polypectomy and myomectomy. Patient has history of chronic headaches for which she takes Imitrex regularly. Patient is slightly overweight.  Exam: Abdomen: Soft nontender no rebound or guarding Pelvic: And urethra Skene was within normal limits Vagina: No lesions or discharge Cervix no lesions or discharge Uterus: Slightly anteverted normal size shape and consistency Adnexa: No palpable masses or tenderness Rectal exam not done  Last menstrual period reported mid-June urine pregnancy today the office negative  Wet prep negative  GC and Chlamydia culture obtained results pending at time of this dictation  Assessment/plan: #1 nonspecific vaginal discharge inconclusive on wet prep GC and clinical depending #2 overweight #3 primary infertility #4 oligomenorrhea #5 chronic headache #6 PCO S.  Patient will be placed on Provera 10 mg to take 1 by mouth daily for 5-10 days to start her cycle after she returns next week for a sonohysterogram to make sure that there has been no recurrent endometrial polyps or submucous myoma before we initiate ovulation induction medication.

## 2013-05-14 LAB — PROLACTIN: Prolactin: 9.2 ng/mL

## 2013-05-14 LAB — GC/CHLAMYDIA PROBE AMP: GC Probe RNA: NEGATIVE

## 2013-05-16 ENCOUNTER — Other Ambulatory Visit: Payer: Self-pay | Admitting: Gynecology

## 2013-05-16 ENCOUNTER — Ambulatory Visit (INDEPENDENT_AMBULATORY_CARE_PROVIDER_SITE_OTHER): Payer: Federal, State, Local not specified - PPO

## 2013-05-16 ENCOUNTER — Encounter: Payer: Self-pay | Admitting: Gynecology

## 2013-05-16 ENCOUNTER — Ambulatory Visit (INDEPENDENT_AMBULATORY_CARE_PROVIDER_SITE_OTHER): Payer: Federal, State, Local not specified - PPO | Admitting: Gynecology

## 2013-05-16 DIAGNOSIS — E282 Polycystic ovarian syndrome: Secondary | ICD-10-CM

## 2013-05-16 DIAGNOSIS — N979 Female infertility, unspecified: Secondary | ICD-10-CM

## 2013-05-16 DIAGNOSIS — D259 Leiomyoma of uterus, unspecified: Secondary | ICD-10-CM

## 2013-05-16 DIAGNOSIS — N970 Female infertility associated with anovulation: Secondary | ICD-10-CM

## 2013-05-16 DIAGNOSIS — Z8742 Personal history of other diseases of the female genital tract: Secondary | ICD-10-CM

## 2013-05-16 DIAGNOSIS — E663 Overweight: Secondary | ICD-10-CM

## 2013-05-16 DIAGNOSIS — N840 Polyp of corpus uteri: Secondary | ICD-10-CM

## 2013-05-16 DIAGNOSIS — N915 Oligomenorrhea, unspecified: Secondary | ICD-10-CM

## 2013-05-16 NOTE — Progress Notes (Signed)
Patient presented to the office today for a sonohysterogram. Patient approximately one year ago had resectoscopic polypectomy and myomectomy by my partner Dr.second. Patient has a history of oligomenorrhea and PCO S. She has been with her current partner for approximately 9 months and has been interested in proceeding with getting pregnant.patient had a negative urine pregnancy test last Friday. Her TSH and prolactin, and hemoglobin A1c were normal. GC and chlamydia culture done last visit was negative as well. Patient was given prescription for 10 mg to take 1 by mouth daily for the next 5-10 days the waiting for a sonohysterogram to be completed as well as her annual exam 2 days from now.  Ultrasound today: Uterus measures 7.6 x 5.0 x 4.3 cm with endometrial stripe of 9.2 mm. Anteverted uterus deviated to the patient's right. T-shaped endometrial cavity was noted. Right and left ovary numerous follicles less than 5 mm. Sonohysterogram after instilling sterile saline demonstrated a right anterior uterine defect measuring 16 x 5 x 8 mm polypoid in appearance.  Assessment/plan: Patient with primary infertility, oligomenorrhea, PCO S. An overweight. Approximately one year ago patient had resectoscopic myomectomy and polypectomy by my partner who retired. Findings of large endometrial polyp was noted on sonohysterogram today. We will schedule an outpatient resectoscopic polypectomy before we start ovulation induction medication on this patient. Literature and information was provided. She will return to the office this Thursday for full gynecological exam. She will then begin her Provera after her exam end of this week.

## 2013-05-19 ENCOUNTER — Ambulatory Visit (INDEPENDENT_AMBULATORY_CARE_PROVIDER_SITE_OTHER): Payer: Federal, State, Local not specified - PPO | Admitting: Gynecology

## 2013-05-19 ENCOUNTER — Encounter: Payer: Self-pay | Admitting: Gynecology

## 2013-05-19 VITALS — BP 130/86 | Ht 63.25 in | Wt 183.0 lb

## 2013-05-19 DIAGNOSIS — N912 Amenorrhea, unspecified: Secondary | ICD-10-CM

## 2013-05-19 DIAGNOSIS — N840 Polyp of corpus uteri: Secondary | ICD-10-CM

## 2013-05-19 DIAGNOSIS — Z01419 Encounter for gynecological examination (general) (routine) without abnormal findings: Secondary | ICD-10-CM

## 2013-05-19 DIAGNOSIS — E282 Polycystic ovarian syndrome: Secondary | ICD-10-CM

## 2013-05-19 LAB — PREGNANCY, URINE: Preg Test, Ur: NEGATIVE

## 2013-05-19 NOTE — Patient Instructions (Addendum)
Hysteroscopy Hysteroscopy is a procedure used for looking inside the womb (uterus). It may be done for many different reasons, including:  To evaluate abnormal bleeding, fibroid (benign, noncancerous) tumors, polyps, scar tissue (adhesions), and possibly cancer of the uterus.  To look for lumps (tumors) and other uterine growths.  To look for causes of why a woman cannot get pregnant (infertility), causes of recurrent loss of pregnancy (miscarriages), or a lost intrauterine device (IUD).  To perform a sterilization by blocking the fallopian tubes from inside the uterus. A hysteroscopy should be done right after a menstrual period to be sure you are not pregnant. LET YOUR CAREGIVER KNOW ABOUT:   Allergies.  Medicines taken, including herbs, eyedrops, over-the-counter medicines, and creams.  Use of steroids (by mouth or creams).  Previous problems with anesthetics or numbing medicines.  History of bleeding or blood problems.  History of blood clots.  Possibility of pregnancy, if this applies.  Previous surgery.  Other health problems. RISKS AND COMPLICATIONS   Putting a hole in the uterus.  Excessive bleeding.  Infection.  Damage to the cervix.  Injury to other organs.  Allergic reaction to medicines.  Too much fluid used in the uterus for the procedure. BEFORE THE PROCEDURE   Do not take aspirin or blood thinners for a week before the procedure, or as directed. It can cause bleeding.  Arrive at least 60 minutes before the procedure or as directed to read and sign the necessary forms.  Arrange for someone to take you home after the procedure.  If you smoke, do not smoke for 2 weeks before the procedure. PROCEDURE   Your caregiver may give you medicine to relax you. He or she may also give you a medicine that numbs the area around the cervix (local anesthetic) or a medicine that makes you sleep (general anesthesia).  Sometimes, a medicine is placed in the cervix  the day before the procedure. This medicine makes the cervix have a larger opening (dilate). This makes it easier for the instrument to be inserted into the uterus.  A small instrument (hysteroscope) is inserted through the vagina into the uterus. This instrument is similar to a pencil-sized telescope with a light.  During the procedure, air or a liquid is put into the uterus, which allows the surgeon to see better.  Sometimes, tissue is gently scraped from inside the uterus. These tissue samples are sent to a specialist who looks at tissue samples (pathologist). The pathologist will give a report to your caregiver. This will help your caregiver decide if further treatment is necessary. The report will also help your caregiver decide on the best treatment if the test comes back abnormal. AFTER THE PROCEDURE   If you had a general anesthetic, you may be groggy for a couple hours after the procedure.  If you had a local anesthetic, you will be advised to rest at the surgical center or caregiver's office until you are stable and feel ready to go home.  You may have some cramping for a couple days.  You may have bleeding, which varies from light spotting for a few days to menstrual-like bleeding for up to 3 to 7 days. This is normal.  Have someone take you home. FINDING OUT THE RESULTS OF YOUR TEST Not all test results are available during your visit. If your test results are not back during the visit, make an appointment with your caregiver to find out the results. Do not assume everything is normal if you   have not heard from your caregiver or the medical facility. It is important for you to follow up on all of your test results. HOME CARE INSTRUCTIONS   Do not drive for 24 hours or as instructed.  Only take over-the-counter or prescription medicines for pain, discomfort, or fever as directed by your caregiver.  Do not take aspirin. It can cause or aggravate bleeding.  Do not drive or drink  alcohol while taking pain medicine.  You may resume your usual diet.  Do not use tampons, douche, or have sexual intercourse for 2 weeks, or as advised by your caregiver.  Rest and sleep for the first 24 to 48 hours.  Take your temperature twice a day for 4 to 5 days. Write it down. Give these temperatures to your caregiver if they are abnormal (above 98.6 F or 37.0 C).  Take medicines your caregiver has ordered as directed.  Follow your caregiver's advice regarding diet, exercise, lifting, driving, and general activities.  Take showers instead of baths for 2 weeks, or as recommended by your caregiver.  If you develop constipation:  Take a mild laxative with the advice of your caregiver.  Eat bran foods.  Drink enough water and fluids to keep your urine clear or pale yellow.  Try to have someone with you or available to you for the first 24 to 48 hours, especially if you had a general anesthetic.  Make sure you and your family understand everything about your operation and recovery.  Follow your caregiver's advice regarding follow-up appointments and Pap smears. SEEK MEDICAL CARE IF:   You feel dizzy or lightheaded.  You feel sick to your stomach (nauseous).  You develop abnormal vaginal discharge.  You develop a rash.  You have an abnormal reaction or allergy to your medicine.  You need stronger pain medicine. SEEK IMMEDIATE MEDICAL CARE IF:   Bleeding is heavier than a normal menstrual period or you have blood clots.  You have an oral temperature above 102 F (38.9 C), not controlled by medicine.  You have increasing cramps or pains not relieved with medicine.  You develop belly (abdominal) pain that does not seem to be related to the same area of earlier cramping and pain.  You pass out.  You develop pain in the tops of your shoulders (shoulder strap areas).  You develop shortness of breath. MAKE SURE YOU:   Understand these instructions.  Will watch  your condition.  Will get help right away if you are not doing well or get worse. Document Released: 01/12/2001 Document Revised: 12/29/2011 Document Reviewed: 05/07/2009 [employer] Patient Information 2014 [employer].  

## 2013-05-19 NOTE — Progress Notes (Signed)
Erika Osborne is an 27 y.o. female. 4 annual exam in preop consultation. Patient was seen in the office on July 28 for sonohysterogram. Patient approximately one year ago had resectoscopic polypectomy and myomectomy by my partner Dr.second. Patient has a history of oligomenorrhea and PCO S. She has been with her current partner for approximately 9 months and has been interested in proceeding with getting pregnant.patient had a negative urine pregnancy test last Friday. Her TSH and prolactin, and hemoglobin A1c were normal. GC and chlamydia culture done last visit was negative as well. Patient was given prescription for 10 mg to take 1 by mouth daily for the next 5-10 days the waiting for a sonohysterogram to be completed  Ultrasound July 28: Uterus measures 7.6 x 5.0 x 4.3 cm with endometrial stripe of 9.2 mm. Anteverted uterus deviated to the patient's right. T-shaped endometrial cavity was noted. Right and left ovary numerous follicles less than 5 mm. Sonohysterogram after instilling sterile saline demonstrated a right anterior uterine defect measuring 16 x 5 x 8 mm polypoid in appearance. Patient denies any prior history of abnormal Pap smears. Patient scheduled for resectoscope polypectomy August 15 of this year.    Pertinent Gynecological History: Menses: Oligomenorrhea Bleeding: oligomenorrhea Contraception: none DES exposure: denies Blood transfusions: none Sexually transmitted diseases: no past history Previous GYN Procedures: Resectoscopic polypectomy  Last mammogram: not indicated Date: none indicated Last pap: normal Date: 2013 OB History: G0, P0   Menstrual History: Menarche age: 10  Patient's last menstrual period was 04/03/2013. Period Duration (Days): 7-8 Period Pattern: Irregular Menstrual Flow: Moderate Menstrual Control: Tampon Dysmenorrhea: Moderate Dysmenorrhea Symptoms: Cramping;Headache  Past Medical History  Diagnosis Date  . 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 Menometrorrhagia 12/14/2008  . STD (sexually transmitted disease) 2010    treatment for chlamydia  . Migraines   . Asthma   . Endometrial polyp     Past Surgical History  Procedure Laterality Date  . Colposcopy    . Dilation and curettage of uterus    . Hysteroscopy      Family History  Problem Relation Age of Onset  . Diabetes Father   . Diabetes Paternal Grandfather     Social History:  reports that she quit smoking about 17 months ago. She does not have any smokeless tobacco history on file. She reports that she drinks about 0.5 ounces of alcohol per week. She reports that she does not use illicit drugs.  Allergies: No Known Allergies   (Not in a hospital admission)  REVIEW OF SYSTEMS: A ROS was performed and pertinent positives and negatives are included in the history.  GENERAL: No fevers or chills. HEENT: No change in vision, no earache, sore throat or sinus congestion. NECK: No pain or stiffness. CARDIOVASCULAR: No chest pain or pressure. No palpitations. PULMONARY: No shortness of breath, cough or wheeze. GASTROINTESTINAL: No abdominal pain, nausea, vomiting or diarrhea, melena or bright red blood per rectum. GENITOURINARY: No urinary frequency, urgency, hesitancy or dysuria. MUSCULOSKELETAL: No joint or muscle pain, no back pain, no recent trauma. DERMATOLOGIC: No rash, no itching, no lesions. ENDOCRINE: No polyuria, polydipsia, no heat or cold intolerance. No recent change in weight. HEMATOLOGICAL: No anemia or easy bruising or bleeding. NEUROLOGIC: No headache, seizures, numbness, tingling or weakness. PSYCHIATRIC: No depression, no loss of interest in normal activity or change in sleep pattern.     Blood pressure 130/86, height 5' 3.25" (1.607 m), weight 183 lb (83.008 kg), last menstrual period 04/03/2013.  Physical Exam:  HEENT:unremarkable Neck:Supple, midline, no thyroid megaly, no carotid bruits Lungs:  Clear to auscultation no  rhonchi's or wheezes Heart:Regular rate and rhythm, no murmurs or gallops Breast Exam:symmetric in appearance no palpable masses or tenderness Abdomen:soft nontender no rebound guarding Pelvic:BUSwithin normal limits Vagina:no lesions or discharge Cervix:no lesions or discharge Uterus:anteverted normal size shape and consistency Adnexa:no palpable mass or tenderness Extremities: No cords, no edema Rectal:not examine  Assessment/plan:Patient with primary infertility, oligomenorrhea, PCO S. An overweight. Approximately one year ago patient had resectoscopic myomectomy and polypectomy by my partner who retired. Findings of large endometrial polyp was noted  A recent sonohysterogram. Patient scheduled for August 15 undergo resectoscopic polypectomy. The following risk for the surgery were discussed as follows.                        Patient was counseled as to the risk of surgery to include the following:  1. Infection (prohylactic antibiotics will be administered)  2. DVT/Pulmonary Embolism (prophylactic pneumo compression stockings will be used)  3.Trauma to internal organs requiring additional surgical procedure to repair any injury to     Internal organs requiring perhaps additional hospitalization days.  4.Hemmorhage requiring transfusion and blood products which carry risks such as   anaphylactic reaction, hepatitis and AIDS  Patient had received literature information on the procedure scheduled and all her questions were answered and fully accepts all risk.  Patient will have a CBC, cholesterol and urinalysis drawn today. If her ureter and success is negative today she wouldn't proceed with taking the Provera as previously prescribed.  [facility] HMD3:49 PMTD@ Erika Osborne 05/19/2013, 3:44 PM

## 2013-05-20 LAB — CBC WITH DIFFERENTIAL/PLATELET
Basophils Absolute: 0 10*3/uL (ref 0.0–0.1)
Basophils Relative: 1 % (ref 0–1)
Eosinophils Absolute: 0.2 10*3/uL (ref 0.0–0.7)
Lymphs Abs: 3 10*3/uL (ref 0.7–4.0)
MCH: 24.9 pg — ABNORMAL LOW (ref 26.0–34.0)
MCHC: 32.6 g/dL (ref 30.0–36.0)
Neutrophils Relative %: 39 % — ABNORMAL LOW (ref 43–77)
Platelets: 260 10*3/uL (ref 150–400)
RBC: 4.58 MIL/uL (ref 3.87–5.11)
RDW: 15 % (ref 11.5–15.5)

## 2013-05-25 ENCOUNTER — Telehealth: Payer: Self-pay

## 2013-05-25 NOTE — Telephone Encounter (Signed)
Patient called.  She said she just found out on 05/21/13 that when she turned 27yo that her dad's ins [employer] dropped her.  She called to get her own policy and they told her that her new policy would be eff 06/20/13.  She does not have any cards or info on this policy. She said that they indicated they could not start her new policy on 8/3 that it would have to begin 9/1.  I told her I am concerned if this lapse in coverage might give them the opportunity to attach a pre-existing waiver to her policy.  I suggested she have her dad call and check on his policy and see if it doesn't cover her through the rest of August.  She will check on the details and let me know. She may want to reschedule her surgery but for now we will leave it scheduled as it.

## 2013-05-26 ENCOUNTER — Telehealth: Payer: Self-pay

## 2013-05-26 NOTE — Telephone Encounter (Signed)
Patient's father called [employer] to check on policy.  He was told that patient is covered for 30 days past her 27th birthday.  Patient has her own plan to start 06/20/13. She is comfortable proceeding with surgery on 8/21 and paying her pre-payment of $260 in advance as we discussed.

## 2013-06-03 ENCOUNTER — Telehealth: Payer: Self-pay | Admitting: *Deleted

## 2013-06-03 MED ORDER — MEGESTROL ACETATE 40 MG PO TABS: 40.0000 mg | ORAL_TABLET | Freq: Two times a day (BID) | ORAL | Status: DC

## 2013-06-03 NOTE — Telephone Encounter (Signed)
Please call and prescription for Megace 40 mg twice a day # 40. She should use barrier contraception during this time.

## 2013-06-03 NOTE — Telephone Encounter (Signed)
Pt is scheduled for surgery on 06/09/13 for Hysteroscopy pt is having some light bleeding since taking provera 10 mg per note on 05/19/13 "Patient was given prescription for 10 mg to take 1 by mouth daily for the next 5-10 days the waiting for a sonohysterogram to be completed". Pt is wearing a pad that she is change once a day. Pt asked does she need something to stop bleeding prior to surgery? Please advise

## 2013-06-03 NOTE — Telephone Encounter (Signed)
Pt informed with the below note, rx sent. 

## 2013-06-08 MED ORDER — DEXTROSE 5 % IV SOLN
2.0000 g | INTRAVENOUS | Status: AC
  Administered 2013-06-09: 2 g via INTRAVENOUS
  Filled 2013-06-08: qty 2

## 2013-06-09 ENCOUNTER — Encounter (HOSPITAL_COMMUNITY)
Admission: RE | Disposition: A | Discharge status: Home / Self Care | Payer: Self-pay | Source: Ambulatory Visit | Attending: Gynecology

## 2013-06-09 ENCOUNTER — Ambulatory Visit (HOSPITAL_COMMUNITY)
Admission: RE | Admit: 2013-06-09 | Discharge: 2013-06-09 | Disposition: A | Discharge status: Home / Self Care | Payer: Federal, State, Local not specified - PPO | Source: Ambulatory Visit | Attending: Gynecology | Admitting: Gynecology

## 2013-06-09 ENCOUNTER — Ambulatory Visit (HOSPITAL_COMMUNITY): Payer: Federal, State, Local not specified - PPO | Admitting: Anesthesiology

## 2013-06-09 ENCOUNTER — Encounter (HOSPITAL_COMMUNITY): Payer: Self-pay | Admitting: Anesthesiology

## 2013-06-09 DIAGNOSIS — N840 Polyp of corpus uteri: Secondary | ICD-10-CM

## 2013-06-09 DIAGNOSIS — N979 Female infertility, unspecified: Secondary | ICD-10-CM | POA: Insufficient documentation

## 2013-06-09 DIAGNOSIS — N949 Unspecified condition associated with female genital organs and menstrual cycle: Secondary | ICD-10-CM

## 2013-06-09 DIAGNOSIS — E669 Obesity, unspecified: Secondary | ICD-10-CM | POA: Insufficient documentation

## 2013-06-09 DIAGNOSIS — N915 Oligomenorrhea, unspecified: Secondary | ICD-10-CM | POA: Insufficient documentation

## 2013-06-09 DIAGNOSIS — N938 Other specified abnormal uterine and vaginal bleeding: Secondary | ICD-10-CM | POA: Insufficient documentation

## 2013-06-09 DIAGNOSIS — Z9889 Other specified postprocedural states: Secondary | ICD-10-CM

## 2013-06-09 HISTORY — PX: DILATATION & CURETTAGE/HYSTEROSCOPY WITH TRUECLEAR: SHX6353

## 2013-06-09 LAB — URINALYSIS, ROUTINE W REFLEX MICROSCOPIC
Ketones, ur: NEGATIVE mg/dL
Leukocytes, UA: NEGATIVE
Protein, ur: NEGATIVE mg/dL
Urobilinogen, UA: 0.2 mg/dL (ref 0.0–1.0)

## 2013-06-09 LAB — CBC
MCHC: 32.3 g/dL (ref 30.0–36.0)
Platelets: 270 10*3/uL (ref 150–400)
RDW: 14.3 % (ref 11.5–15.5)

## 2013-06-09 LAB — PREGNANCY, URINE: Preg Test, Ur: NEGATIVE

## 2013-06-09 SURGERY — DILATATION & CURETTAGE/HYSTEROSCOPY WITH TRUCLEAR
Anesthesia: General | Site: Abdomen | Wound class: Clean Contaminated

## 2013-06-09 MED ORDER — PROPOFOL 10 MG/ML IV EMUL
INTRAVENOUS | Status: AC
  Filled 2013-06-09: qty 20

## 2013-06-09 MED ORDER — ONDANSETRON HCL 4 MG/2ML IJ SOLN
INTRAMUSCULAR | Status: AC
  Filled 2013-06-09: qty 2

## 2013-06-09 MED ORDER — ONDANSETRON HCL 4 MG/2ML IJ SOLN
INTRAMUSCULAR | Status: DC | PRN
  Administered 2013-06-09: 4 mg via INTRAVENOUS

## 2013-06-09 MED ORDER — LIDOCAINE HCL (CARDIAC) 20 MG/ML IV SOLN
INTRAVENOUS | Status: AC
  Filled 2013-06-09: qty 5

## 2013-06-09 MED ORDER — MIDAZOLAM HCL 2 MG/2ML IJ SOLN
INTRAMUSCULAR | Status: AC
  Filled 2013-06-09: qty 2

## 2013-06-09 MED ORDER — ALBUTEROL SULFATE HFA 108 (90 BASE) MCG/ACT IN AERS
INHALATION_SPRAY | RESPIRATORY_TRACT | Status: AC
  Filled 2013-06-09: qty 6.7

## 2013-06-09 MED ORDER — MEPERIDINE HCL 25 MG/ML IJ SOLN: 6.2500 mg | INTRAMUSCULAR | Status: DC | PRN

## 2013-06-09 MED ORDER — KETOROLAC TROMETHAMINE 30 MG/ML IJ SOLN
INTRAMUSCULAR | Status: AC
  Filled 2013-06-09: qty 1

## 2013-06-09 MED ORDER — MIDAZOLAM HCL 2 MG/2ML IJ SOLN
0.5000 mg | Freq: Once | INTRAMUSCULAR | Status: DC | PRN
Start: 2013-06-09 — End: 2013-06-09

## 2013-06-09 MED ORDER — ALBUTEROL SULFATE HFA 108 (90 BASE) MCG/ACT IN AERS
INHALATION_SPRAY | RESPIRATORY_TRACT | Status: DC | PRN
  Administered 2013-06-09: 2 via RESPIRATORY_TRACT

## 2013-06-09 MED ORDER — FENTANYL CITRATE 0.05 MG/ML IJ SOLN
INTRAMUSCULAR | Status: DC | PRN
  Administered 2013-06-09: 100 ug via INTRAVENOUS

## 2013-06-09 MED ORDER — OXYCODONE-ACETAMINOPHEN 5-325 MG PO TABS
ORAL_TABLET | ORAL | Status: AC
  Filled 2013-06-09: qty 1

## 2013-06-09 MED ORDER — FENTANYL CITRATE 0.05 MG/ML IJ SOLN
INTRAMUSCULAR | Status: AC
  Filled 2013-06-09: qty 5

## 2013-06-09 MED ORDER — SILVER NITRATE-POT NITRATE 75-25 % EX MISC
CUTANEOUS | Status: DC | PRN
  Administered 2013-06-09: 6

## 2013-06-09 MED ORDER — KETOROLAC TROMETHAMINE 30 MG/ML IJ SOLN: 15.0000 mg | Freq: Once | INTRAMUSCULAR | Status: DC | PRN

## 2013-06-09 MED ORDER — LACTATED RINGERS IV SOLN
INTRAVENOUS | Status: DC
  Administered 2013-06-09 (×2): via INTRAVENOUS

## 2013-06-09 MED ORDER — KETOROLAC TROMETHAMINE 30 MG/ML IJ SOLN
INTRAMUSCULAR | Status: DC | PRN
  Administered 2013-06-09: 30 mg via INTRAVENOUS

## 2013-06-09 MED ORDER — METOCLOPRAMIDE HCL 10 MG PO TABS: 10.0000 mg | ORAL_TABLET | Freq: Three times a day (TID) | ORAL | Status: DC

## 2013-06-09 MED ORDER — FENTANYL CITRATE 0.05 MG/ML IJ SOLN: 25.0000 ug | INTRAMUSCULAR | Status: DC | PRN

## 2013-06-09 MED ORDER — PROMETHAZINE HCL 25 MG/ML IJ SOLN: 6.2500 mg | INTRAMUSCULAR | Status: DC | PRN

## 2013-06-09 MED ORDER — LIDOCAINE HCL (CARDIAC) 20 MG/ML IV SOLN
INTRAVENOUS | Status: DC | PRN
  Administered 2013-06-09: 60 mg via INTRAVENOUS

## 2013-06-09 MED ORDER — PROPOFOL 10 MG/ML IV BOLUS
INTRAVENOUS | Status: DC | PRN
  Administered 2013-06-09: 200 mg via INTRAVENOUS

## 2013-06-09 MED ORDER — SODIUM CHLORIDE 0.9 % IR SOLN
Status: DC | PRN
  Administered 2013-06-09: 3000 mL

## 2013-06-09 MED ORDER — MIDAZOLAM HCL 5 MG/5ML IJ SOLN
INTRAMUSCULAR | Status: DC | PRN
  Administered 2013-06-09: 2 mg via INTRAVENOUS

## 2013-06-09 MED ORDER — OXYCODONE-ACETAMINOPHEN 5-325 MG PO TABS
1.0000 | ORAL_TABLET | Freq: Once | ORAL | Status: AC
  Administered 2013-06-09: 1 via ORAL

## 2013-06-09 SURGICAL SUPPLY — 27 items
BLADE INCISOR TRUC PLUS 2.9 (ABLATOR) IMPLANT
CANISTERS HI-FLOW 3000CC (CANNISTER) ×2 IMPLANT
CATH FOLEY 2WAY SLVR 30CC 16FR (CATHETERS) IMPLANT
CATH ROBINSON RED A/P 16FR (CATHETERS) ×2 IMPLANT
CLOTH BEACON ORANGE TIMEOUT ST (SAFETY) ×2 IMPLANT
CONTAINER PREFILL 10% NBF 60ML (FORM) ×4 IMPLANT
CORD ACTIVE DISPOSABLE (ELECTRODE)
CORD ELECTRO ACTIVE DISP (ELECTRODE) IMPLANT
DRAPE HYSTEROSCOPY (DRAPE) ×2 IMPLANT
DRESSING TELFA 8X3 (GAUZE/BANDAGES/DRESSINGS) ×2 IMPLANT
ELECT REM PT RETURN 9FT ADLT (ELECTROSURGICAL)
ELECT VAPORTRODE GRVD BAR (ELECTRODE) IMPLANT
ELECTRODE REM PT RTRN 9FT ADLT (ELECTROSURGICAL) IMPLANT
GLOVE BIOGEL PI IND STRL 8 (GLOVE) ×1 IMPLANT
GLOVE BIOGEL PI INDICATOR 8 (GLOVE) ×1
GLOVE ECLIPSE 7.5 STRL STRAW (GLOVE) ×4 IMPLANT
GOWN STRL REIN XL XLG (GOWN DISPOSABLE) ×4 IMPLANT
INCISOR TRUC PLUS BLADE 2.9 (ABLATOR) ×2
KIT HYSTEROSCOPY TRUCLEAR (ABLATOR) ×1 IMPLANT
MORCELLATOR RECIP TRUCLEAR 4.0 (ABLATOR) IMPLANT
PACK VAGINAL MINOR WOMEN LF (CUSTOM PROCEDURE TRAY) ×2 IMPLANT
PAD OB MATERNITY 4.3X12.25 (PERSONAL CARE ITEMS) ×2 IMPLANT
PAD PREP 24X48 CUFFED NSTRL (MISCELLANEOUS) ×2 IMPLANT
PLUG CATH AND CAP STER (CATHETERS) IMPLANT
SYR 30ML LL (SYRINGE) IMPLANT
TOWEL OR 17X24 6PK STRL BLUE (TOWEL DISPOSABLE) ×4 IMPLANT
WATER STERILE IRR 1000ML POUR (IV SOLUTION) ×2 IMPLANT

## 2013-06-09 NOTE — Transfer of Care (Signed)
Immediate Anesthesia Transfer of Care Note  Patient: Erika Osborne  Procedure(s) Performed: Procedure(s) with comments: DILATATION & CURETTAGE/HYSTEROSCOPY WITH TRUECLEAR (N/A) - INSERVICE BY JASON  Patient Location: PACU  Anesthesia Type:General  Level of Consciousness: awake, alert  and oriented  Airway & Oxygen Therapy: Patient Spontanous Breathing and Patient connected to nasal cannula oxygen  Post-op Assessment: Report given to PACU RN and Post -op Vital signs reviewed and stable  Post vital signs: stable  Complications: No apparent anesthesia complications

## 2013-06-09 NOTE — Anesthesia Postprocedure Evaluation (Signed)
Anesthesia Post Note  Patient: Erika Osborne  Procedure(s) Performed: Procedure(s) (LRB): DILATATION & CURETTAGE/HYSTEROSCOPY WITH TRUECLEAR (N/A)  Anesthesia type: General  Patient location: PACU  Post pain: Pain level controlled  Post assessment: Post-op Vital signs reviewed  Last Vitals:  Filed Vitals:   06/09/13 1400  BP: 124/77  Pulse: 87  Temp:   Resp: 16    Post vital signs: Reviewed  Level of consciousness: sedated  Complications: No apparent anesthesia complications

## 2013-06-09 NOTE — H&P (View-Only) (Signed)
Erika Osborne is an 26 y.o. female. 4 annual exam in preop consultation. Patient was seen in the office on July 28 for sonohysterogram. Patient approximately one year ago had resectoscopic polypectomy and myomectomy by my partner Dr.second. Patient has a history of oligomenorrhea and PCO S. She has been with her current partner for approximately 9 months and has been interested in proceeding with getting pregnant.patient had a negative urine pregnancy test last Friday. Her TSH and prolactin, and hemoglobin A1c were normal. GC and chlamydia culture done last visit was negative as well. Patient was given prescription for 10 mg to take 1 by mouth daily for the next 5-10 days the waiting for a sonohysterogram to be completed  Ultrasound July 28: Uterus measures 7.6 x 5.0 x 4.3 cm with endometrial stripe of 9.2 mm. Anteverted uterus deviated to the patient's right. T-shaped endometrial cavity was noted. Right and left ovary numerous follicles less than 5 mm. Sonohysterogram after instilling sterile saline demonstrated a right anterior uterine defect measuring 16 x 5 x 8 mm polypoid in appearance. Patient denies any prior history of abnormal Pap smears. Patient scheduled for resectoscope polypectomy August 15 of this year.    Pertinent Gynecological History: Menses: Oligomenorrhea Bleeding: oligomenorrhea Contraception: none DES exposure: denies Blood transfusions: none Sexually transmitted diseases: no past history Previous GYN Procedures: Resectoscopic polypectomy  Last mammogram: not indicated Date: none indicated Last pap: normal Date: 2013 OB History: G0, P0   Menstrual History: Menarche age: 13  Patient's last menstrual period was 04/03/2013. Period Duration (Days): 7-8 Period Pattern: Irregular Menstrual Flow: Moderate Menstrual Control: Tampon Dysmenorrhea: Moderate Dysmenorrhea Symptoms: Cramping;Headache  Past Medical History  Diagnosis Date  . 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 Menometrorrhagia 12/14/2008  . STD (sexually transmitted disease) 2010    treatment for chlamydia  . Migraines   . Asthma   . Endometrial polyp     Past Surgical History  Procedure Laterality Date  . Colposcopy    . Dilation and curettage of uterus    . Hysteroscopy      Family History  Problem Relation Age of Onset  . Diabetes Father   . Diabetes Paternal Grandfather     Social History:  reports that she quit smoking about 17 months ago. She does not have any smokeless tobacco history on file. She reports that she drinks about 0.5 ounces of alcohol per week. She reports that she does not use illicit drugs.  Allergies: No Known Allergies   (Not in a hospital admission)  REVIEW OF SYSTEMS: A ROS was performed and pertinent positives and negatives are included in the history.  GENERAL: No fevers or chills. HEENT: No change in vision, no earache, sore throat or sinus congestion. NECK: No pain or stiffness. CARDIOVASCULAR: No chest pain or pressure. No palpitations. PULMONARY: No shortness of breath, cough or wheeze. GASTROINTESTINAL: No abdominal pain, nausea, vomiting or diarrhea, melena or bright red blood per rectum. GENITOURINARY: No urinary frequency, urgency, hesitancy or dysuria. MUSCULOSKELETAL: No joint or muscle pain, no back pain, no recent trauma. DERMATOLOGIC: No rash, no itching, no lesions. ENDOCRINE: No polyuria, polydipsia, no heat or cold intolerance. No recent change in weight. HEMATOLOGICAL: No anemia or easy bruising or bleeding. NEUROLOGIC: No headache, seizures, numbness, tingling or weakness. PSYCHIATRIC: No depression, no loss of interest in normal activity or change in sleep pattern.     Blood pressure 130/86, height 5' 3.25" (1.607 m), weight 183 lb (83.008 kg), last menstrual period 04/03/2013.    Physical Exam:  HEENT:unremarkable Neck:Supple, midline, no thyroid megaly, no carotid bruits Lungs:  Clear to auscultation no  rhonchi's or wheezes Heart:Regular rate and rhythm, no murmurs or gallops Breast Exam:symmetric in appearance no palpable masses or tenderness Abdomen:soft nontender no rebound guarding Pelvic:BUSwithin normal limits Vagina:no lesions or discharge Cervix:no lesions or discharge Uterus:anteverted normal size shape and consistency Adnexa:no palpable mass or tenderness Extremities: No cords, no edema Rectal:not examine  Assessment/plan:Patient with primary infertility, oligomenorrhea, PCO S. An overweight. Approximately one year ago patient had resectoscopic myomectomy and polypectomy by my partner who retired. Findings of large endometrial polyp was noted  A recent sonohysterogram. Patient scheduled for August 15 undergo resectoscopic polypectomy. The following risk for the surgery were discussed as follows.                        Patient was counseled as to the risk of surgery to include the following:  1. Infection (prohylactic antibiotics will be administered)  2. DVT/Pulmonary Embolism (prophylactic pneumo compression stockings will be used)  3.Trauma to internal organs requiring additional surgical procedure to repair any injury to     Internal organs requiring perhaps additional hospitalization days.  4.Hemmorhage requiring transfusion and blood products which carry risks such as   anaphylactic reaction, hepatitis and AIDS  Patient had received literature information on the procedure scheduled and all her questions were answered and fully accepts all risk.  Patient will have a CBC, cholesterol and urinalysis drawn today. If her ureter and success is negative today she wouldn't proceed with taking the Provera as previously prescribed.  Grafton Warzecha HMD3:49 PMTD@ Daysie Helf H 05/19/2013, 3:44 PM  

## 2013-06-09 NOTE — Anesthesia Preprocedure Evaluation (Signed)
Anesthesia Evaluation  Patient identified by MRN, date of birth, ID band Patient awake    Reviewed: Allergy & Precautions, H&P , Patient's Chart, lab work & pertinent test results, reviewed documented beta blocker date and time   History of Anesthesia Complications Negative for: history of anesthetic complications  Airway Mallampati: II TM Distance: >3 FB Neck ROM: full    Dental no notable dental hx.    Pulmonary neg pulmonary ROS, asthma ,  breath sounds clear to auscultation  Pulmonary exam normal       Cardiovascular Exercise Tolerance: Good negative cardio ROS  Rhythm:regular Rate:Normal     Neuro/Psych  Headaches, negative neurological ROS  negative psych ROS   GI/Hepatic negative GI ROS, Neg liver ROS,   Endo/Other  negative endocrine ROS  Renal/GU negative Renal ROS     Musculoskeletal   Abdominal   Peds  Hematology negative hematology ROS (+)   Anesthesia Other Findings CIN I (cervical intraepithelial neoplasia I) 08/16/2009   Menometrorrhagia 12/14/2008      STD (sexually transmitted disease) 2010 treatment for chlamydia Migraines        Asthma     Endometrial polyp           Reproductive/Obstetrics negative OB ROS                           Anesthesia Physical Anesthesia Plan  ASA: II  Anesthesia Plan: General LMA   Post-op Pain Management:    Induction:   Airway Management Planned:   Additional Equipment:   Intra-op Plan:   Post-operative Plan:   Informed Consent: I have reviewed the patients History and Physical, chart, labs and discussed the procedure including the risks, benefits and alternatives for the proposed anesthesia with the patient or authorized representative who has indicated his/her understanding and acceptance.   Dental Advisory Given  Plan Discussed with: CRNA, Surgeon and Anesthesiologist  Anesthesia Plan Comments:         Anesthesia  Quick Evaluation

## 2013-06-09 NOTE — Op Note (Signed)
06/09/2013  2:51 PM  PATIENT:  Erika Osborne  27 y.o. female  PRE-OPERATIVE DIAGNOSIS:  endometrial polyp,  DUB, infertility  POST-OPERATIVE DIAGNOSIS:  endometrial polyp, DUB, infertility  PROCEDURE:  Procedure(s): DILATATION & CURETTAGE/HYSTEROSCOPY WITH TRUECLEAR  SURGEON:  Surgeon(s): Ok Edwards, MD  ANESTHESIA:   general  FINDINGS:patient was noted to have several endometrial polyps mostly at the lower uterine segment, both tubal ostia were identified  DESCRIPTION OF OPERATION:the patient was taken to the operating room where she underwent successful general endotracheal anesthesia. A timeout was undertaken for proper identification of the patient and procedure to be undertaken. Patient did receive a gram of Cefotan for prophylaxis and had PAS stockings for DVT prophylaxis. Patient's lower abdomen vagina and perineum were prepped and draped in usual sterile fashion. Exam under anesthesia demonstrated an anteverted uterus with no palpable adnexal masses. A red rubber Robinson catheter was inserted to evacuate the bladder was contents for approximately 75 cc. A speculum was inserted into the vagina. The single-tooth tenaculum was placed on the anterior cervical lip. The uterus sounded to 7-1/2 cm. Normal saline was the distending media. A 5 mm hysteroscope with a 2.9 mm sizer blade was introduced into the uterine cavity. Systematic inspection demonstrated the above mentioned findings. With a true clear cyst on (resectoscopic morcellator) all the individual polyps removed and submitted for histological evaluation. Fluid deficit was 250 cc. The single-tooth tenaculum was removed. The patient was extubated and transferred to recovery room stable vital signs.  ESTIMATED BLOOD LOSS:minimal   Intake/Output Summary (Last 24 hours) at 06/09/13 1451 Last data filed at 06/09/13 1346  Gross per 24 hour  Intake   1300 ml  Output    140 ml  Net   1160 ml     BLOOD ADMINISTERED:none    LOCAL MEDICATIONS USED:  NONE  SPECIMEN:  Source of Specimen:  Endometrial polyps  DISPOSITION OF SPECIMEN:  PATHOLOGY  COUNTS:  YES  PLAN OF CARE: Transfer to PACU  [facility] HMD2:51 PMTD@

## 2013-06-09 NOTE — Interval H&P Note (Signed)
History and Physical Interval Note:  06/09/2013 12:52 PM  Erika Osborne  has presented today for surgery, with the diagnosis of endometrial polyp   The various methods of treatment have been discussed with the patient and family. After consideration of risks, benefits and other options for treatment, the patient has consented to  Procedure(s) with comments: DILATATION & CURETTAGE/HYSTEROSCOPY WITH TRUECLEAR (N/A) - INSERVICE BY JASON as a surgical intervention .  The patient's history has been reviewed, patient examined, no change in status, stable for surgery.  I have reviewed the patient's chart and labs.  Questions were answered to the patient's satisfaction.     Ok Edwards

## 2013-06-09 NOTE — Interval H&P Note (Signed)
History and Physical Interval Note:  06/09/2013 12:04 PM  Erika Osborne  has presented today for surgery, with the diagnosis of endometrial polyp   The various methods of treatment have been discussed with the patient and family. After consideration of risks, benefits and other options for treatment, the patient has consented to  Procedure(s) with comments: DILATATION & CURETTAGE/HYSTEROSCOPY WITH TRUECLEAR (N/A) - INSERVICE BY JASON as a surgical intervention .  The patient's history has been reviewed, patient examined, no change in status, stable for surgery.  I have reviewed the patient's chart and labs.  Questions were answered to the patient's satisfaction.     Ok Edwards

## 2013-06-10 ENCOUNTER — Encounter (HOSPITAL_COMMUNITY): Payer: Self-pay | Admitting: Gynecology

## 2013-06-27 ENCOUNTER — Ambulatory Visit: Payer: Federal, State, Local not specified - PPO | Admitting: Gynecology

## 2013-07-07 ENCOUNTER — Encounter: Payer: Self-pay | Admitting: Gynecology

## 2013-07-07 ENCOUNTER — Ambulatory Visit (INDEPENDENT_AMBULATORY_CARE_PROVIDER_SITE_OTHER): Payer: Federal, State, Local not specified - PPO | Admitting: Gynecology

## 2013-07-07 VITALS — BP 130/88

## 2013-07-07 DIAGNOSIS — Z9889 Other specified postprocedural states: Secondary | ICD-10-CM

## 2013-07-07 NOTE — Progress Notes (Signed)
27 are all patient with history of PCO S. And dysfunctional uterine bleeding and infertility presented to the office for her postop visit. On 06/09/2013 patient underwent resectoscopic polypectomy with TrueClear morcellator. Patient has done well some spotting is to be expected but otherwise no major complaints. Pathology report as follows:  Endometrial polyp - BENIGN ENDOMETRIAL POLYP WITH PSEUDODECIDUALIZED STROMA, NO ATYPIA, HYPERPLASIA OR MALIGNANCY.  Pictures for surgery were sure the patient  Exam: Bartholin's urethra Skene's within normal limits Vagina: No lesions or discharge Cervix: No lesions or discharge Uterus: Anteverted normal size shape and consistency Adnexa: No palpable mass or tenderness Rectal exam: Not done  Assessment/plan:patient's status post resectoscopic polypectomy contributed to dysfunction uterine bleeding infertility. Review of patient's records indicates several years ago my previous partner had done similar operation on her present issues. Patient does have history of PCO S. She has history of oligomenorrhea. Patient would like to get pregnant sometime in the next few months. She will maintain menstrual calendar for the next 3 months and return to see me in January to discuss initiation of ovulation induction medication. Meanwhile she will use the ovulation predictor kit and time her intercourse accordingly. She'll be started on prenatal vitamins today. Recent TSH, prolactin and hemoglobin A1c were normal. GC committed culture recently done was also negative.

## 2013-07-27 ENCOUNTER — Telehealth: Payer: Self-pay | Admitting: *Deleted

## 2013-07-27 MED ORDER — DOXYCYCLINE HYCLATE 100 MG PO CAPS: 100.0000 mg | ORAL_CAPSULE | Freq: Two times a day (BID) | ORAL | Status: DC

## 2013-07-27 MED ORDER — MEGESTROL ACETATE 40 MG PO TABS: 40.0000 mg | ORAL_TABLET | Freq: Two times a day (BID) | ORAL | Status: DC

## 2013-07-27 NOTE — Telephone Encounter (Signed)
Please call in the following prescriptions: Megace 40 mg twice a day #30 Vibramycin 100 mg twice a day for 7 days  Tell her that the Megace as to stop the bleeding. The Vibramycin is in the event she has a mild inflammation in the uterine lining as a result from her surgery.

## 2013-07-27 NOTE — Telephone Encounter (Signed)
Pt informed with the below note, rx sent to pharmacy.

## 2013-07-27 NOTE — Telephone Encounter (Signed)
Pt had resectoscopic polypectomy surgery on 06/09/13, had post op visit on 07/07/13. Pt c/o heavy bleeding, she has been bleeding everyday since surgery heavy to light then back to heavy. Heavy now wearing tampon and pad changing every 2 hours. Pt would like something to stop bleeding. Please advise

## 2013-10-24 ENCOUNTER — Emergency Department (HOSPITAL_COMMUNITY)
Admission: EM | Admit: 2013-10-24 | Discharge: 2013-10-24 | Disposition: A | Discharge status: Home / Self Care | Payer: No Typology Code available for payment source | Attending: Emergency Medicine | Admitting: Emergency Medicine

## 2013-10-24 ENCOUNTER — Encounter (HOSPITAL_COMMUNITY): Payer: Self-pay | Admitting: Emergency Medicine

## 2013-10-24 DIAGNOSIS — Z3202 Encounter for pregnancy test, result negative: Secondary | ICD-10-CM | POA: Insufficient documentation

## 2013-10-24 DIAGNOSIS — J3489 Other specified disorders of nose and nasal sinuses: Secondary | ICD-10-CM | POA: Insufficient documentation

## 2013-10-24 DIAGNOSIS — G43909 Migraine, unspecified, not intractable, without status migrainosus: Secondary | ICD-10-CM | POA: Insufficient documentation

## 2013-10-24 DIAGNOSIS — R05 Cough: Secondary | ICD-10-CM | POA: Insufficient documentation

## 2013-10-24 DIAGNOSIS — R112 Nausea with vomiting, unspecified: Secondary | ICD-10-CM

## 2013-10-24 DIAGNOSIS — Z8742 Personal history of other diseases of the female genital tract: Secondary | ICD-10-CM | POA: Insufficient documentation

## 2013-10-24 DIAGNOSIS — J45909 Unspecified asthma, uncomplicated: Secondary | ICD-10-CM | POA: Insufficient documentation

## 2013-10-24 DIAGNOSIS — R519 Headache, unspecified: Secondary | ICD-10-CM

## 2013-10-24 DIAGNOSIS — R059 Cough, unspecified: Secondary | ICD-10-CM | POA: Insufficient documentation

## 2013-10-24 DIAGNOSIS — Z87891 Personal history of nicotine dependence: Secondary | ICD-10-CM | POA: Insufficient documentation

## 2013-10-24 DIAGNOSIS — Z79899 Other long term (current) drug therapy: Secondary | ICD-10-CM | POA: Insufficient documentation

## 2013-10-24 DIAGNOSIS — Z8619 Personal history of other infectious and parasitic diseases: Secondary | ICD-10-CM | POA: Insufficient documentation

## 2013-10-24 DIAGNOSIS — IMO0001 Reserved for inherently not codable concepts without codable children: Secondary | ICD-10-CM | POA: Insufficient documentation

## 2013-10-24 DIAGNOSIS — R51 Headache: Secondary | ICD-10-CM

## 2013-10-24 LAB — URINE MICROSCOPIC-ADD ON

## 2013-10-24 LAB — URINALYSIS, ROUTINE W REFLEX MICROSCOPIC
BILIRUBIN URINE: NEGATIVE
GLUCOSE, UA: NEGATIVE mg/dL
KETONES UR: NEGATIVE mg/dL
Leukocytes, UA: NEGATIVE
Nitrite: NEGATIVE
PROTEIN: 30 mg/dL — AB
Specific Gravity, Urine: 1.039 — ABNORMAL HIGH (ref 1.005–1.030)
UROBILINOGEN UA: 0.2 mg/dL (ref 0.0–1.0)
pH: 5 (ref 5.0–8.0)

## 2013-10-24 LAB — POCT PREGNANCY, URINE: Preg Test, Ur: NEGATIVE

## 2013-10-24 MED ORDER — ACETAMINOPHEN 325 MG PO TABS
650.0000 mg | ORAL_TABLET | Freq: Four times a day (QID) | ORAL | Status: DC | PRN
  Administered 2013-10-24: 650 mg via ORAL
  Filled 2013-10-24: qty 2

## 2013-10-24 MED ORDER — ONDANSETRON 4 MG PO TBDP: ORAL_TABLET | ORAL | Status: DC

## 2013-10-24 MED ORDER — SODIUM CHLORIDE 0.9 % IV BOLUS (SEPSIS): 500.0000 mL | Freq: Once | INTRAVENOUS | Status: DC

## 2013-10-24 MED ORDER — METOCLOPRAMIDE HCL 5 MG/ML IJ SOLN
10.0000 mg | Freq: Once | INTRAMUSCULAR | Status: AC
  Administered 2013-10-24: 10 mg via INTRAVENOUS
  Filled 2013-10-24: qty 2

## 2013-10-24 MED ORDER — SODIUM CHLORIDE 0.9 % IV BOLUS (SEPSIS)
1000.0000 mL | Freq: Once | INTRAVENOUS | Status: AC
  Administered 2013-10-24: 1000 mL via INTRAVENOUS

## 2013-10-24 MED ORDER — DIPHENHYDRAMINE HCL 50 MG/ML IJ SOLN
12.5000 mg | Freq: Once | INTRAMUSCULAR | Status: AC
  Administered 2013-10-24: 12.5 mg via INTRAVENOUS
  Filled 2013-10-24: qty 1

## 2013-10-24 MED ORDER — DEXAMETHASONE SODIUM PHOSPHATE 10 MG/ML IJ SOLN: 10.0000 mg | Freq: Once | INTRAMUSCULAR | Status: DC

## 2013-10-24 NOTE — ED Notes (Signed)
Pt states she was seen at [facility] yesterday for migraine x 2 days, non-productive cough, nausea, and vomiting.  States symptoms are now worse with fever and chills.

## 2013-10-24 NOTE — ED Provider Notes (Signed)
CSN: [id number]     Arrival date & time 10/24/13  0436 History   First MD Initiated Contact with Patient 10/24/13 [phone]     Chief Complaint  Patient presents with  . Migraine  . Cough  . Emesis   (Consider location/radiation/quality/duration/timing/severity/associated sxs/prior Treatment) Patient is a 28 y.o. female presenting with migraines, cough, and vomiting.  Migraine Associated symptoms include congestion, coughing, headaches, myalgias and vomiting. Pertinent negatives include no neck pain, rash or sore throat.  Cough Associated symptoms: headaches, myalgias and rhinorrhea   Associated symptoms: no ear pain, no rash and no sore throat   Emesis Associated symptoms: headaches and myalgias   Associated symptoms: no sore throat    28 yo female presents with "migraine" x 3 days. Patient was seen at urgent care for migraine yesterday. Patient states she has a hx of migraines that usually respond to "topamax" but this one has not. Patient describes pain as unilateral, intermittent (Right Side) throbbing rated 8/10. Patient tried tylenol and topamax without improvement. Patient states yesterday she developed some Fever/CHills, body aches, congestion, and runny nose. Admits to N/V that started yesterday evening with 7+ episodes. Patient states she is unable to tolerate POs. Denies diarrhea. Denies abdominal pain. Patient is sexually active and does not remember her LMP. Denies vaginal discharge, bleeding, and pain.  Past Medical History  Diagnosis Date  . 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 Menometrorrhagia 12/14/2008  . STD (sexually transmitted disease) 2010    treatment for chlamydia  . Migraines   . Asthma   . Endometrial polyp    Past Surgical History  Procedure Laterality Date  . Colposcopy    . Dilation and curettage of uterus    . Hysteroscopy    . Dilatation & curettage/hysteroscopy with trueclear N/A 06/09/2013    Procedure: DILATATION &  CURETTAGE/HYSTEROSCOPY WITH TRUECLEAR;  Surgeon: Terrance Mass, MD;  Location: [facility];  Service: Gynecology;  Laterality: N/A;  INSERVICE BY JASON   Family History  Problem Relation Age of Onset  . Diabetes Father   . Diabetes Paternal Grandfather    History  Substance Use Topics  . Smoking status: Former Smoker    Quit date: 12/15/2011  . Smokeless tobacco: Not on file  . Alcohol Use: 0.5 oz/week    1 drink(s) per week     Comment: socially   OB History   Grav Para Term Preterm Abortions TAB SAB Ect Mult Living   0              Review of Systems  HENT: Positive for congestion, rhinorrhea and sinus pressure. Negative for dental problem, ear pain, sore throat and trouble swallowing.   Eyes: Negative for visual disturbance.  Respiratory: Positive for cough.   Gastrointestinal: Positive for vomiting.  Genitourinary: Negative for difficulty urinating and pelvic pain.  Musculoskeletal: Positive for myalgias. Negative for neck pain and neck stiffness.  Skin: Negative for rash.  Neurological: Positive for headaches. Negative for dizziness.    Allergies  Review of patient's allergies indicates no known allergies.  Home Medications   Current Outpatient Rx  Name  Route  Sig  Dispense  Refill  . albuterol (PROVENTIL HFA;VENTOLIN HFA) 108 (90 BASE) MCG/ACT inhaler   Inhalation   Inhale 2 puffs into the lungs every 6 (six) hours as needed. For wheezing         . cetirizine (ZYRTEC) 10 MG tablet   Oral   Take 10 mg by mouth daily.         Marland Kitchen  guaiFENesin-codeine (GUAIATUSSIN AC) 100-10 MG/5ML syrup   Oral   Take 5 mLs by mouth every 4 (four) hours as needed for cough.         . ondansetron (ZOFRAN) 4 MG tablet   Oral   Take 4 mg by mouth 2 (two) times daily as needed for nausea or vomiting.         . topiramate (TOPAMAX) 50 MG tablet   Oral   Take 50 mg by mouth 2 (two) times daily.         . ondansetron (ZOFRAN ODT) 4 MG disintegrating tablet      4mg  ODT q4  hours prn nausea/vomit   10 tablet   0    BP 107/60  Pulse 92  Temp(Src) 98.4 F (36.9 C) (Oral)  Resp 20  SpO2 99% Physical Exam  Nursing note and vitals reviewed. Constitutional: She is oriented to person, place, and time. She appears well-developed and well-nourished. No distress.  HENT:  Head: Normocephalic and atraumatic.  Nose: Mucosal edema and rhinorrhea present.  Mouth/Throat: Uvula is midline and oropharynx is clear and moist.  Eyes: Conjunctivae and EOM are normal. Pupils are equal, round, and reactive to light. No scleral icterus.  Neck: Normal range of motion. Neck supple.  Cardiovascular: Normal rate and regular rhythm.  Exam reveals no gallop and no friction rub.   No murmur heard. Pulmonary/Chest: Effort normal and breath sounds normal. No respiratory distress. She has no wheezes. She has no rales.  Abdominal: Soft. Normal appearance and bowel sounds are normal. There is no tenderness.  Musculoskeletal: Normal range of motion. She exhibits no edema.  Lymphadenopathy:    She has no cervical adenopathy.  Neurological: She is alert and oriented to person, place, and time. She has normal strength. No cranial nerve deficit or sensory deficit. Gait normal.  Skin: Skin is warm and dry. No rash noted. She is not diaphoretic.  Psychiatric: She has a normal mood and affect. Her behavior is normal.    ED Course  Procedures (including critical care time) Labs Review Labs Reviewed  URINALYSIS, ROUTINE W REFLEX MICROSCOPIC - Abnormal; Notable for the following:    Color, Urine AMBER (*)    APPearance CLOUDY (*)    Specific Gravity, Urine 1.039 (*)    Hgb urine dipstick TRACE (*)    Protein, ur 30 (*)    All other components within normal limits  URINE MICROSCOPIC-ADD ON  POCT PREGNANCY, URINE   Imaging Revi No results found.  EKG Interpretation   None       MDM   1. HA (headache)   2. Nausea & vomiting    Patient febrile on admission, though resolved with  Tylenol in ED. Patient tachycardic on presentation, suspect secondary to volume depletion from N/V, resolved with IV fluids. Patients pain improved with treatment in ED and Nausea has resolved. Patient tolerating POs in ED.    UA consistent with mild dehydration. Urine preg negative. Patient states she is ready to leave. Advised to return to ED should symptoms worsen, develops fever that does not respond to OTC tylenol/motrin, unable to hold down liquids, or Headache persistently worsens. Resource guide provided for followup. Discharged in good condition.   Meds given in ED:  Medications  metoCLOPramide (REGLAN) injection 10 mg (10 mg Intravenous Given 10/24/13 0902)  diphenhydrAMINE (BENADRYL) injection 12.5 mg (12.5 mg Intravenous Given 10/24/13 0900)  sodium chloride 0.9 % bolus 1,000 mL (0 mLs Intravenous Stopped 10/24/13 0952)  Discharge Medication List as of 10/24/2013  9:41 AM    START taking these medications   Details  ondansetron (ZOFRAN ODT) 4 MG disintegrating tablet 4mg  ODT q4 hours prn nausea/vomit, Print            Sherrie George, [place] 10/24/13 1956

## 2013-10-24 NOTE — Discharge Instructions (Signed)
Return to ED should your symptoms worsen, you develop fever that does not respond to OTC tylenol/motrin, unable to hold down liquids, or Headache persistently worsens. Resource guide provided for followup.   Emergency Department Resource Guide 1) Find a Doctor and Pay Out of Pocket Although you won't have to find out who is covered by your insurance plan, it is a good idea to ask around and get recommendations. You will then need to call the office and see if the doctor you have chosen will accept you as a new patient and what types of options they offer for patients who are self-pay. Some doctors offer discounts or will set up payment plans for their patients who do not have insurance, but you will need to ask so you aren't surprised when you get to your appointment.  2) Contact Your Local Health Department Not all health departments have doctors that can see patients for sick visits, but many do, so it is worth a call to see if yours does. If you don't know where your local health department is, you can check in your phone book. The CDC also has a tool to help you locate your state's health department, and many state websites also have listings of all of their local health departments.  3) Find a [facility] If your illness is not likely to be very severe or complicated, you may want to try a walk in clinic. These are popping up all over the country in pharmacies, drugstores, and shopping centers. They're usually staffed by nurse practitioners or physician assistants that have been trained to treat common illnesses and complaints. They're usually fairly quick and inexpensive. However, if you have serious medical issues or chronic medical problems, these are probably not your best option.  No Primary Care Doctor: - Call Health Connect at  [phone] - they can help you locate a primary care doctor that  accepts your insurance, provides certain services, etc. - Physician Referral Service-  [phone]  Chronic Pain Problems: Organization         Address  Phone   Notes  [facility]  [phone] Patients need to be referred by their primary care doctor.   Medication Assistance: Organization         Address  Phone   Notes  [facility] Medication [facility] [address] [place], [place] [address] [phone] --Must be a resident of [facility] -- Must have NO insurance coverage whatsoever (no Medicaid/ Medicare, etc.) -- The pt. MUST have a primary care doctor that directs their care regularly and follows them in the community   [facility]ist  [phone]   [employer]  [phone]    Agencies that provide inexpensive medical care: Organization         Address  Phone   Notes  [place]  [phone]   [facility]    [phone]   [facility] [place], [place] [address] [phone]   [place] [address], [place] [phone]   Planned Parenthood    [phone]   [facility]    [phone]   [place] and [place] [address], [place] Phone:  [phone], Fax:  [phone] Hours of Operation:  9 am - 6 pm, M-F.  Also accepts Medicaid/Medicare and self-pay.  [facility]  [address], [place] Phone: [phone], Fax: [phone]. Hours of Operation:  8:30 am - 5:30 pm, M-F.  Also accepts Medicaid and self-pay.  [facility] [address] [place] Phone: [phone]   [place], [place], [place] [phone], Ext. [phone] Mondays & Thursdays: 7-9 AM.  First 15 patients are seen on a first come, first serve basis.     City Providers:  Organization         Address  Phone   Notes  [facility] [address],  [place] [phone] Also accepts self-pay patients.  [facility] [address], [place]  [phone]   [place], [address], [place] [phone]   [facility] [address], [place] [phone]   Lucianne Lei [address], [place]   [phone] Only accepts [place] Access [place] patients after they have their name applied to their card.   Self-Pay (no insurance) in [facility]:  Organization         Address  Phone   Notes  Sickle Cell Patients, [facility] [place] [phone]   [facility] [place] [phone]   [facility] [place], [place], [place] [phone]   [facility]/Dr. Osei-Bonsu  [address] [place] or [address], [place] [phone] Phone number for both [place] and [place] locations is the same.  Urgent Medical and [facility] [address], [place] [phone]   [facility] [address] [place] or [address] [phone]   [facility] [address], [place] [phone], phone; [phone], fax Sees patients 1st and 3rd Saturday of every month.  Must not qualify for public or private insurance (i.e. Medicaid, Medicare, [place] Health Choice, Veterans' Benefits)  Household income should be no more than 200% of the poverty level The clinic cannot treat you if you are pregnant or think you are pregnant  Sexually transmitted diseases are not treated at the clinic.    Dental Care: Organization         Address  Phone  Notes  [facility] Department of [facility] [phone] Accepts children up to age 81 who are enrolled in  [place] or [place]; pregnant women with a Medicaid card; and children who have applied for Medicaid or [place] Health Choice, but were declined, whose parents can pay a reduced fee at time of service.  [facility] Department of [facility]  [address], [place] [phone] Accepts children up to age 87 who are enrolled in [place] or [place]; pregnant women with a Medicaid card; and children who have applied for Medicaid or [place] Health Choice, but were declined, whose parents can pay a reduced fee at time of service.  [facility] PRO[facility]  Veteran [phone] Patients are seen by appointment only. Walk-ins are not accepted. [place] will see patients 22 years of age and older. Monday - Tuesday (8am-5pm) Most Wednesdays (8:30-5pm) $30 per visit, cash only  [place]  Access PROGRAM  [address], [facility] [phone] Patients are seen by appointment only. Walk-ins are not accepted. [place] will see patients 83 years of age and older. One Wednesday Evening (Monthly: Volunteer Based).  $30 per visit, cash only  [place]  [phone] for adults; Children under age 17, call Graduate Pediatric Dentistry at [phone]. Children aged 46-14, please call [phone] to request a pediatric application.  Dental services are provided in all areas of dental care including fillings, crowns and bridges, complete and partial dentures, implants, gum treatment, root canals, and extractions. Preventive care is also provided. Treatment is provided to both adults and children. Patients are selected via a lottery and there is often a waiting list.   [facility] [address] [place]  [phone] [email or web address]   [facility] [address] [place], [place] [phone], Ext. [phone] Second and Fourth Thursday of each month, opens at 6:30  AM; Clinic ends at 9 AM.  Patients are seen on a first-come first-served basis, and a limited number are seen during each clinic.   [facility]  [address] Hillard Danker [place], [place] [phone]   Eligibility Requirements You must have lived in [place], [place], or [place] counties for at least the last three months.   You cannot be eligible for state or federal sponsored [employer], including [employer], [place], or [employer].   You generally cannot be eligible for healthcare insurance through your employer.    How to apply: Eligibility screenings are held every Tuesday and Wednesday afternoon from 1:00 pm until 4:00 pm. You do not need an appointment for the interview!  [facility] [address], [place], [place]   [place]  [place] Department  [place]  [phone]    Behavioral Health Resources in the Community: Intensive Outpatient Programs Organization         Address  Phone  Notes  [place]. [address] [place], [place] [phone]   [facility] Outpatient [address] [place], [place]   ADS: [facility] Drug [facility] [address] [place], [place]   [place] [address],  [place], [place] or [phone]   Substance Abuse Resources Organization         Address  Phone  Notes  Alcohol and Drug Services  [phone]   [place]  [phone]   [facility] [place]   Chinita Pester  [phone]   Residential & Outpatient Substance Abuse Program  [phone]   Psychological Services Organization         Address  Phone  Notes  [facility] [place]  [place]  [phone]   [place] [address], [place] [phone] or  [phone]    Mobile Crisis Teams Organization         Address  Phone  Notes  [facility], Mobile Crisis Care Unit  [phone]   [facility]  [address] [place], [place]   [place] [address] [place] [phone]    Self-Help/Support Groups Organization         Address  Phone             Notes  Mental Health  [place] - variety of support groups  336- H3156881 Call for more information  Narcotics Anonymous (NA), [facility] [address], [employer] [place]  2 meetings at this location   Special educational needs teacher         Address  Phone  Notes  ASAP Residential Treatment [place],    [place]  [phone]   [facility]  [address] [place] [address], [place], [place]   [place], [place] [phone] Admissions: 8am-3pm M-F  Incentives Substance [place] [address],    [place], [place] [phone]   [facility] [address] [place], [place], [place]   [facility] [address],  [place], [place]   [facility] Programs - Intensive Outpatient [address] Kristeen [address], [place], [place]   [facility] [place].,  [place], [place] [phone] or [phone]   [facility] Treatment Services (RTS) [address] [place], [place] Accepts Medicaid  [place] [address],  [place] [phone] Substance Abuse/Addiction Treatment   [facility] Organization         Address  Phone  Notes  [facility]  [phone]   Domenic Schwab, PhD [address] Arlis Porta [place], [place]   [phone] or [phone]   [place], [place] [phone]   [facility] [address]  [place], [place] [phone] Insurance/Medicaid/sponsorship through [facility] and Families [address] [place]                                    [place], [place] [phone] [place] [address], [place] [phone]    Dr. Adele Schilder  [phone]   [facility] [place] Dept. 1) [address] [place] 2) [place] 3)  [address], [place] [phone]  [phone]   [place] [phone] or [phone] (After Hours)

## 2013-10-26 ENCOUNTER — Encounter: Payer: Self-pay | Admitting: Gynecology

## 2013-10-26 ENCOUNTER — Ambulatory Visit (INDEPENDENT_AMBULATORY_CARE_PROVIDER_SITE_OTHER): Payer: PRIVATE HEALTH INSURANCE | Admitting: Gynecology

## 2013-10-26 VITALS — BP 122/80

## 2013-10-26 DIAGNOSIS — B373 Candidiasis of vulva and vagina: Secondary | ICD-10-CM

## 2013-10-26 DIAGNOSIS — N938 Other specified abnormal uterine and vaginal bleeding: Secondary | ICD-10-CM

## 2013-10-26 DIAGNOSIS — N925 Other specified irregular menstruation: Secondary | ICD-10-CM

## 2013-10-26 DIAGNOSIS — N949 Unspecified condition associated with female genital organs and menstrual cycle: Secondary | ICD-10-CM

## 2013-10-26 DIAGNOSIS — N898 Other specified noninflammatory disorders of vagina: Secondary | ICD-10-CM

## 2013-10-26 DIAGNOSIS — B3731 Acute candidiasis of vulva and vagina: Secondary | ICD-10-CM

## 2013-10-26 LAB — PREGNANCY, URINE: PREG TEST UR: NEGATIVE

## 2013-10-26 LAB — WET PREP FOR TRICH, YEAST, CLUE
CLUE CELLS WET PREP: NONE SEEN
TRICH WET PREP: NONE SEEN

## 2013-10-26 MED ORDER — MEGESTROL ACETATE 40 MG PO TABS: 40.0000 mg | ORAL_TABLET | Freq: Two times a day (BID) | ORAL | Status: DC

## 2013-10-26 MED ORDER — FLUCONAZOLE 150 MG PO TABS: 150.0000 mg | ORAL_TABLET | Freq: Once | ORAL | Status: DC

## 2013-10-26 NOTE — Progress Notes (Signed)
   Procedural patient had presented to the office today complaining of persistent irregular vaginal bleeding since August 2014 when she had resectoscopic polypectomy. She was also complaining of vaginal discharge with pruritus. She is in a monogamous relationship.  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 She denies any easy bruisability but she does state that she does bleed when she brushes her teeth that time from her gums. Patient had a resectoscopic polypectomy in 2013 by my  Previous partner and I did her last one in August of 2014. Pathology report did confirm benign polyps. Pictures from her surgery were shown to the patient today once again. Patient dictated Megace for 10 days which stopped her bleeding but she regained her bleeding irregularity 1 she came off medications. She is anxious to get pregnant.  A urine pregnancy test was done today in the office which was negative.  Exam: Thurnell Garbe medical assistant present  Bartholin's urethra Skene glands within normal limits Vagina: Some dark red blood was noted in the vaginal vault Cervix: No active bleeding noted Uterus: Anteverted normal size shape and consistency Adnexa: No palpable masses or tenderness Rectal exam: Not done  Wet prep moderate yeast  Assessment/plan:for patient's vaginal yeast infection prescription for Diflucan 150 mg was provided. We will prescribe once again Megace 40 mg for 10-14 days. She was instructed to use condoms during this time. She'll return back to the office for followup sonohysterogram to compare with previous study from July 2014. We'll also check her CBC today along with a TSH and prolactin level will also screened her for von Willebrand's disease. I discussed with her potential scenarios of all the above tests are negative to include 6 months of continuous oral contraceptive pill or to wait a year and place a Mirena IUD in an  effort to maintain her fertility and cut down her bleeding. We'll discuss further when she returns to the office.

## 2013-10-26 NOTE — ED Provider Notes (Signed)
Medical screening examination/treatment/procedure(s) were performed by non-physician practitioner and as supervising physician I was immediately available for consultation/collaboration.  EKG Interpretation   None        Richarda Blade, MD 10/26/13 1714

## 2013-10-28 ENCOUNTER — Ambulatory Visit: Payer: Self-pay | Admitting: Gynecology

## 2013-10-28 LAB — VON WILLEBRAND PANEL
COAGULATION FACTOR VIII: 273 % — AB (ref 73–140)
RISTOCETIN CO-FACTOR, PLASMA: 116 % (ref 42–200)
Von Willebrand Antigen, Plasma: 217 % (ref 50–217)

## 2013-10-31 ENCOUNTER — Other Ambulatory Visit: Payer: Self-pay | Admitting: Gynecology

## 2013-10-31 DIAGNOSIS — N938 Other specified abnormal uterine and vaginal bleeding: Secondary | ICD-10-CM

## 2013-11-10 ENCOUNTER — Telehealth: Payer: Self-pay | Admitting: Gynecology

## 2013-11-10 ENCOUNTER — Ambulatory Visit (INDEPENDENT_AMBULATORY_CARE_PROVIDER_SITE_OTHER): Payer: PRIVATE HEALTH INSURANCE | Admitting: Gynecology

## 2013-11-10 ENCOUNTER — Other Ambulatory Visit: Payer: Self-pay | Admitting: Gynecology

## 2013-11-10 ENCOUNTER — Ambulatory Visit (INDEPENDENT_AMBULATORY_CARE_PROVIDER_SITE_OTHER): Payer: PRIVATE HEALTH INSURANCE

## 2013-11-10 DIAGNOSIS — D251 Intramural leiomyoma of uterus: Secondary | ICD-10-CM

## 2013-11-10 DIAGNOSIS — D259 Leiomyoma of uterus, unspecified: Secondary | ICD-10-CM

## 2013-11-10 DIAGNOSIS — N938 Other specified abnormal uterine and vaginal bleeding: Secondary | ICD-10-CM

## 2013-11-10 DIAGNOSIS — N8300 Follicular cyst of ovary, unspecified side: Secondary | ICD-10-CM

## 2013-11-10 DIAGNOSIS — N920 Excessive and frequent menstruation with regular cycle: Secondary | ICD-10-CM

## 2013-11-10 DIAGNOSIS — N949 Unspecified condition associated with female genital organs and menstrual cycle: Secondary | ICD-10-CM

## 2013-11-10 LAB — CBC WITH DIFFERENTIAL/PLATELET
BASOS ABS: 0.1 10*3/uL (ref 0.0–0.1)
Basophils Relative: 1 % (ref 0–1)
EOS PCT: 2 % (ref 0–5)
Eosinophils Absolute: 0.1 10*3/uL (ref 0.0–0.7)
HCT: 40.4 % (ref 36.0–46.0)
Hemoglobin: 13.3 g/dL (ref 12.0–15.0)
LYMPHS ABS: 2.8 10*3/uL (ref 0.7–4.0)
LYMPHS PCT: 45 % (ref 12–46)
MCH: 24.9 pg — ABNORMAL LOW (ref 26.0–34.0)
MCHC: 32.9 g/dL (ref 30.0–36.0)
MCV: 75.7 fL — AB (ref 78.0–100.0)
Monocytes Absolute: 0.6 10*3/uL (ref 0.1–1.0)
Monocytes Relative: 9 % (ref 3–12)
NEUTROS ABS: 2.8 10*3/uL (ref 1.7–7.7)
NEUTROS PCT: 43 % (ref 43–77)
PLATELETS: 306 10*3/uL (ref 150–400)
RBC: 5.34 MIL/uL — AB (ref 3.87–5.11)
RDW: 14.7 % (ref 11.5–15.5)
WBC: 6.3 10*3/uL (ref 4.0–10.5)

## 2013-11-10 NOTE — Telephone Encounter (Signed)
11/10/13-Pt has [employer] ins which requires precert for sonohystograms. I spoke with Salomon Fick and she gave me Auth [id number]. Dx codes given were 626.2,626.8//WL

## 2013-11-10 NOTE — Patient Instructions (Signed)
Some one from our staff will call you with appointment date and time to see the hematologist. Continue to take the Megace BUT use condom if sexually active.

## 2013-11-10 NOTE — Progress Notes (Signed)
   Patient is a 28 year old gravida 0 who presented to the office today for a sonohysterogram as a result of her continued vaginal bleeding despite her resectoscopic polypectomy in August of 2014. 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 She denies any easy bruisability but she does state that she does bleed when she brushes her teeth that time from her gums. Patient's bleeding stops when she is on Megace. Patient is wishing to get pregnant in the near future.  Patient had a normal CBC in August of 2014 although her MCV and MCH were slightly low but she did have a normal hemoglobin and hematocrit and platelet count. A von Willebrand panel was ordered which demonstrated that her factor VIII level was elevated at 273 and her von Willebrand antigen and plasma was 217% at the upper range of normal. Her ristocetin cofactor was normal at 116%.  Sonohysterogram today as follows: Uterus measures 8.0 x 5.2 x 3.9 cm with endometrial stripe of 2.7 mm. Patient had 1 single small intramural fibroid measuring 8 x 6 mm. Right and left ovary were otherwise normal. There was no fluid in the cul-de-sac. After instilling sterile saline into the uterine cavity there was no intracavitary defects noted.  Assessment/plan: Patient with persistent vaginal bleeding bleeding despite negative workup. Borderline von Willebrand panel. I'm going to referred to hematologist oncologist further assistant and further testing of any other possible bleeding disorders. Patient will continue on Megace 40 mg twice a day and we will await their recommendations. I had discussed with her consideration of placing her on continuous oral contraceptive pill for a year or the Mirena IUD and then attempt to get pregnant but she does not want to go that route.

## 2013-11-11 ENCOUNTER — Telehealth: Payer: Self-pay | Admitting: *Deleted

## 2013-11-11 NOTE — Telephone Encounter (Signed)
Message copied by Thamas Jaegers on Fri Nov 11, 2013  9:12 AM ------      Message from: Erika Osborne      Created: Thu Nov 10, 2013  5:58 PM       [name] or [name] please schedule a consultation for this patient with a hematologist/oncologist for this patient with borderline von Willebrand disease and menorrhagia. They can see my note in Epic.       ------

## 2013-11-11 NOTE — Telephone Encounter (Signed)
Referral faxed to [facility], they will contact patient to schedule. 

## 2013-11-14 ENCOUNTER — Telehealth: Payer: Self-pay | Admitting: Internal Medicine

## 2013-11-14 NOTE — Telephone Encounter (Signed)
S/w pt and gve np apptr 02/02 @ 1:30 w/Dr. Juliann Mule  Referring Dr. Uvaldo Rising Dx- [facility] [place] [facility] Welcome packet mailed.

## 2013-11-14 NOTE — Telephone Encounter (Signed)
C/D 11/14/13 for appt. 11/21/13

## 2013-11-15 NOTE — Progress Notes (Signed)
Please inform the hematologist oncologist DrJuliann Mule to look at my last encounter note with this patient for his evaluation.

## 2013-11-15 NOTE — Telephone Encounter (Signed)
appt 11/21/13

## 2013-11-21 ENCOUNTER — Ambulatory Visit (HOSPITAL_BASED_OUTPATIENT_CLINIC_OR_DEPARTMENT_OTHER): Payer: No Typology Code available for payment source | Admitting: Internal Medicine

## 2013-11-21 ENCOUNTER — Ambulatory Visit: Payer: No Typology Code available for payment source

## 2013-11-21 ENCOUNTER — Telehealth: Payer: Self-pay | Admitting: Internal Medicine

## 2013-11-21 ENCOUNTER — Encounter: Payer: Self-pay | Admitting: Internal Medicine

## 2013-11-21 ENCOUNTER — Other Ambulatory Visit: Payer: Self-pay | Admitting: Internal Medicine

## 2013-11-21 ENCOUNTER — Other Ambulatory Visit (HOSPITAL_BASED_OUTPATIENT_CLINIC_OR_DEPARTMENT_OTHER): Payer: No Typology Code available for payment source

## 2013-11-21 VITALS — BP 128/92 | HR 79 | Temp 97.0°F | Resp 20 | Ht 63.25 in | Wt 179.7 lb

## 2013-11-21 DIAGNOSIS — N925 Other specified irregular menstruation: Secondary | ICD-10-CM

## 2013-11-21 DIAGNOSIS — D699 Hemorrhagic condition, unspecified: Secondary | ICD-10-CM

## 2013-11-21 DIAGNOSIS — N938 Other specified abnormal uterine and vaginal bleeding: Secondary | ICD-10-CM

## 2013-11-21 DIAGNOSIS — N949 Unspecified condition associated with female genital organs and menstrual cycle: Secondary | ICD-10-CM

## 2013-11-21 LAB — COMPREHENSIVE METABOLIC PANEL (CC13)
ALT: 14 U/L (ref 0–55)
ANION GAP: 11 meq/L (ref 3–11)
AST: 15 U/L (ref 5–34)
Albumin: 4.2 g/dL (ref 3.5–5.0)
Alkaline Phosphatase: 52 U/L (ref 40–150)
BUN: 7.3 mg/dL (ref 7.0–26.0)
CALCIUM: 9.7 mg/dL (ref 8.4–10.4)
CHLORIDE: 108 meq/L (ref 98–109)
CO2: 23 meq/L (ref 22–29)
CREATININE: 0.9 mg/dL (ref 0.6–1.1)
GLUCOSE: 95 mg/dL (ref 70–140)
Potassium: 3.6 mEq/L (ref 3.5–5.1)
Sodium: 142 mEq/L (ref 136–145)
Total Bilirubin: 0.47 mg/dL (ref 0.20–1.20)
Total Protein: 7.9 g/dL (ref 6.4–8.3)

## 2013-11-21 LAB — CBC WITH DIFFERENTIAL/PLATELET
BASO%: 0.2 % (ref 0.0–2.0)
BASOS ABS: 0 10*3/uL (ref 0.0–0.1)
EOS ABS: 0.2 10*3/uL (ref 0.0–0.5)
EOS%: 2.7 % (ref 0.0–7.0)
HEMATOCRIT: 43.4 % (ref 34.8–46.6)
HGB: 13.7 g/dL (ref 11.6–15.9)
LYMPH%: 50.7 % — ABNORMAL HIGH (ref 14.0–49.7)
MCH: 25.1 pg (ref 25.1–34.0)
MCHC: 31.5 g/dL (ref 31.5–36.0)
MCV: 79.6 fL (ref 79.5–101.0)
MONO#: 0.4 10*3/uL (ref 0.1–0.9)
MONO%: 7 % (ref 0.0–14.0)
NEUT%: 39.4 % (ref 38.4–76.8)
NEUTROS ABS: 2.3 10*3/uL (ref 1.5–6.5)
Platelets: 254 10*3/uL (ref 145–400)
RBC: 5.45 10*6/uL (ref 3.70–5.45)
RDW: 15.2 % — ABNORMAL HIGH (ref 11.2–14.5)
WBC: 5.8 10*3/uL (ref 3.9–10.3)
lymph#: 3 10*3/uL (ref 0.9–3.3)

## 2013-11-21 LAB — IVY BLEEDING TIME: BLEEDING TIME: 7 min (ref 2.0–8.0)

## 2013-11-21 LAB — PROTIME-INR
INR: 1.2 — ABNORMAL LOW (ref 2.00–3.50)
PROTIME: 14.4 s — AB (ref 10.6–13.4)

## 2013-11-21 LAB — CHCC SMEAR

## 2013-11-21 NOTE — Progress Notes (Signed)
Checked in new pt with no financial concerns. °

## 2013-11-21 NOTE — Progress Notes (Signed)
[facility] [phone]   [phone]  NEW PATIENT EVALUATION   Name: Erika Osborne Date: 11/22/2013 MRN: [id number] DOB: Aug 25, 1986  PCP: Reynaldo Minium REFERRING PHYSICIAN: Reynaldo Minium  REASON FOR REFERRAL: Dysfunctionable uterine bleeding concerning for Von Willenbrand   HISTORY OF PRESENT ILLNESS:Erika Osborne is a 28 y.o. female who is presents to our office at the request of her OB/GYN doctor (Dr. Reynaldo Minium) for further evaluation and management of her dysfunctional uterine bleeding with concerns for Von Willebrand.     She provided a detailed history regarding her dysfunctional uterine bleeding.  She reports that her menstrual cycles started at age 74.  She described them a normal flow and occurring monthly.  At age 32, while in college, her menstrual cycle changed with menorrhagia described as one pad and one tampon per hour.  These menstrual periods could last for two to three months.  She reports a trial of oral contraceptive pills but described her periods as even more irregular.  At age 38, she stopped OCP as she reports that they did not help.  She had further evaluation by her OB/GYN and at that time concluded the bleeding was due to polyps and little fibroids.  At age 84, she had continuous bleeding until in 2013 she had a resectoscopic polypectomy.  She had a second resectoscopic polypectomy in August 2014.  She was started on megace 40 mg bid in October 2014.  While on megace, had amenorrhea.  She ran out of megace and had a lapse in her insurance, and her periods restarted.  She was seen by Dr. Lily Peer office on 11/10/2013 and she has resumed her megace.     She denies any prior dental procedures.  She denies heavy bruising as a child.  She has not required blood transfusions.  She has intermittently been on iron supplementation.   She has had an intentional weight lost of 45 lbs over the past one year and a half.  She has headaches  very frequently.  She denies a history of pregnancy.  She has two sisters, one with a thyroid problem and a second sister with multiple miscarriages.  A von Willebrand panel was ordered and demonstrated that her factor VIII level was elevated at 273 and her von Willebrand antigen and plasma was 217% at the upper range of normal.  Her ristocetin cofactor was normal at 116%.  By sonohysterogram, sh had 1 single small intramural fibroid measuring 8 x 5 mm.     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     PAST SURGICAL HISTORY: Past Surgical History  Procedure Laterality Date  . Colposcopy    . Dilation and curettage of uterus    . Hysteroscopy    . Dilatation & curettage/hysteroscopy with trueclear N/A 06/09/2013    Procedure: DILATATION & CURETTAGE/HYSTEROSCOPY WITH TRUECLEAR;  Surgeon: Ok Edwards, MD;  Location: [facility];  Service: Gynecology;  Laterality: N/A;  INSERVICE BY JASON     CURRENT MEDICATIONS: has a current medication list which includes the following prescription(s): albuterol and megestrol.   ALLERGIES: Review of patient's allergies indicates no known allergies.   SOCIAL HISTORY:  reports that she quit smoking about 23 months ago. She does not have any smokeless tobacco history on file. She reports that she drinks about 0.5 ounces of alcohol per week. She reports  that she does not use illicit drugs.   FAMILY HISTORY: family history includes Diabetes in her father and paternal grandfather.   LABORATORY DATA:  Results for orders placed in visit on 11/21/13 (from the past 48 hour(s))  CBC WITH DIFFERENTIAL     Status: Abnormal   Collection Time    11/21/13  1:48 PM      Result Value Range   WBC 5.8  3.9 - 10.3 10e3/uL   NEUT# 2.3  1.5 - 6.5 10e3/uL   HGB 13.7  11.6 - 15.9 g/dL   HCT  43.4  34.8 - 46.6 %   Platelets 254  145 - 400 10e3/uL   MCV 79.6  79.5 - 101.0 fL   MCH 25.1  25.1 - 34.0 pg   MCHC 31.5  31.5 - 36.0 g/dL   RBC 5.45  3.70 - 5.45 10e6/uL   RDW 15.2 (*) 11.2 - 14.5 %   lymph# 3.0  0.9 - 3.3 10e3/uL   MONO# 0.4  0.1 - 0.9 10e3/uL   Eosinophils Absolute 0.2  0.0 - 0.5 10e3/uL   Basophils Absolute 0.0  0.0 - 0.1 10e3/uL   NEUT% 39.4  38.4 - 76.8 %   LYMPH% 50.7 (*) 14.0 - 49.7 %   MONO% 7.0  0.0 - 14.0 %   EOS% 2.7  0.0 - 7.0 %   BASO% 0.2  0.0 - 2.0 %  [facility] SMEAR     Status: None   Collection Time    11/21/13  1:48 PM      Result Value Range   Smear Result Smear Available    PROTIME-INR     Status: Abnormal   Collection Time    11/21/13  1:48 PM      Result Value Range   Protime 14.4 (*) 10.6 - 13.4 Seconds   INR 1.20 (*) 2.00 - 3.50   Comment: INR is useful only to assess adequacy of anticoagulation with coumadin when comparing results from different labs. It should not be used to estimate bleeding risk or presence/abscense of coagulopathy in patients not on coumadin. Expected INR ranges for      nontherapeutic patients is 0.88 - 1.12.   Lovenox No    IVY BLEEDING TIME     Status: None   Collection Time    11/21/13  1:48 PM      Result Value Range   Bleeding Time 7.0  2.0 - 8.0 Minutes   Aspirin None in past 5 days    COMPREHENSIVE METABOLIC PANEL (ZO10)     Status: None   Collection Time    11/21/13  1:49 PM      Result Value Range   Sodium 142  136 - 145 mEq/L   Potassium 3.6  3.5 - 5.1 mEq/L   Chloride 108  98 - 109 mEq/L   CO2 23  22 - 29 mEq/L   Glucose 95  70 - 140 mg/dl   BUN 7.3  7.0 - 26.0 mg/dL   Creatinine 0.9  0.6 - 1.1 mg/dL   Total Bilirubin 0.47  0.20 - 1.20 mg/dL   Alkaline Phosphatase 52  40 - 150 U/L   AST 15  5 - 34 U/L   ALT 14  0 - 55 U/L   Total Protein 7.9  6.4 - 8.3 g/dL   Albumin 4.2  3.5 - 5.0 g/dL   Calcium 9.7  8.4 - 10.4 mg/dL   Anion Gap 11  3 - 11 mEq/L  Results for ZAMERIA, VANACKER (MRN  [id number]) as of 11/22/2013 06:18  Ref. Range 11/21/2013 13:49  Fibrinogen Latest Range: 204-475 mg/dL 396   Results for KEHLANI, COLLIS (MRN [id number]) as of 11/22/2013 06:18  Ref. Range 11/21/2013 13:49  APTT Latest Range: 24-37 seconds 29   RADIOGRAPHY: No results found.     REVIEW OF SYSTEMS:  Constitutional: Denies fevers, chills or abnormal weight loss Eyes: Denies blurriness of vision Ears, nose, mouth, throat, and face: Denies mucositis or sore throat Respiratory: Denies cough, dyspnea or wheezes Cardiovascular: Denies palpitation, chest discomfort or lower extremity swelling Gastrointestinal:  Denies nausea, heartburn or change in bowel habits Skin: Denies abnormal skin rashes Lymphatics: Denies new lymphadenopathy or easy bruising Neurological:Denies numbness, tingling or new weaknesses Behavioral/Psych: Mood is stable, no new changes  All other systems were reviewed with the patient and are negative.  PHYSICAL EXAM:  height is 5' 3.25" (1.607 m) and weight is 179 lb 11.2 oz (81.511 kg). Her oral temperature is 97 F (36.1 C). Her blood pressure is 128/92 and her pulse is 79. Her respiration is 20 and oxygen saturation is 100%.    GENERAL:alert, no distress and comfortable; mildly obese.  SKIN: skin color, texture, turgor are normal, no rashes or significant lesions EYES: normal, Conjunctiva are pink and non-injected, sclera clear OROPHARYNX:no exudate, no erythema and lips, buccal mucosa, and tongue normal  NECK: supple, thyroid normal size, non-tender, without nodularity LYMPH:  no palpable lymphadenopathy in the cervical, axillary or inguinal LUNGS: clear to auscultation and percussion with normal breathing effort HEART: regular rate & rhythm and no murmurs and no lower extremity edema ABDOMEN:abdomen soft, non-tender and normal bowel sounds Musculoskeletal:no cyanosis of digits and no clubbing  NEURO: alert & oriented x 3 with fluent speech, no focal motor/sensory  deficits   IMPRESSION: Loxie Welte is a 28 y.o. female with a history of   PLAN:  1.  Rule out bleeding disorder. -- Our initial approach would be to obtain aPTT, PT and CBC.  Her CBC reveal a normal plt count and both her PT and aPTT is within normal limits as well.   Despite a normal plt count, plt dysfunction can also be considered.  She has a normal bleeding time.  PTF-100 can be obtained as well.  Increased fibrolysis was considered but her fibronogen is also within normal limits.   --Repeat Von Willenbrand testing is pending.  Prior von Willebrand panel was ordered and demonstrated that her factor VIII level was elevated at 273 and her von Willebrand antigen and plasma was 217% at the upper range of normal.  Her ristocetin cofactor was normal at 116%.  Most patients with VWD present with moderate to severe mucocutaneous bleeding due to reduced levels of von Willebrand factor (VWF). They may have a prolonged aPTT due to a mild to moderate concordant deficiency of factor VIII. Her elevated factor VIII could be increased due to her megace or oral contraceptive pills. A peripheral blood smear has been ordered.   --We will make additional recommendations with review of her smear and likely obtain additional plt function testing.  Despite her history of bleeding, her hemoglobin today is within normal limits.   2. Dysfunctional uterine bleeding.  --Has a history of polyps and one uterine fibroid.   Managed with megace and her OB/HYN.   3. Follow-up. --Please come back in 2 weeks for additional plt tests and to discuss lab results as indicated above.    All questions were answered. The patient  knows to call the clinic with any problems, questions or concerns. We can certainly see the patient much sooner if necessary.  I spent 25 minutes counseling the patient face to face. The total time spent in the appointment was 45 minutes.    Donnelle Olmeda, MD 11/22/2013 5:45  AM

## 2013-11-21 NOTE — Telephone Encounter (Signed)
gv adn printeda ptp sched and avs for pt for Feb °

## 2013-11-21 NOTE — Patient Instructions (Signed)
Uterine Fibroid A uterine fibroid is a growth (tumor) that occurs in your uterus. This type of tumor is not cancerous and does not spread out of the uterus. You can have one or many fibroids. Fibroids can vary in size, weight, and where they grow in the uterus. Some can become quite large. Most fibroids do not require medical treatment, but some can cause pain or heavy bleeding during and between periods. CAUSES  A fibroid is the result of a single uterine cell that keeps growing (unregulated), which is different than most cells in the human body. Most cells have a control mechanism that keeps them from reproducing without control.  SIGNS AND SYMPTOMS   Bleeding.  Pelvic pain and pressure.  Bladder problems due to the size of the fibroid.  Infertility and miscarriages depending on the size and location of the fibroid. DIAGNOSIS  Uterine fibroids are diagnosed through a physical exam. Your health care provider may feel the lumpy tumors during a pelvic exam. Ultrasonography may be done to get information regarding size, location, and number of tumors.  TREATMENT   Your health care provider may recommend watchful waiting. This involves getting the fibroid checked by your health care provider to see if it grows or shrinks.   Hormone treatment or an intrauterine device (IUD) may be prescribed.   Surgery may be needed to remove the fibroids (myomectomy) or the uterus (hysterectomy). This depends on your situation. When fibroids interfere with fertility and a woman wants to become pregnant, a health care provider may recommend having the fibroids removed.  [place] care depends on how you were treated. In general:   Keep all follow-up appointments with your health care provider.   Only take over-the-counter or prescription medicines as directed by your health care provider. If you were prescribed a hormone treatment, take the hormone medicines exactly as directed. Do not  take aspirin. It can cause bleeding.   Talk to your health care provider about taking iron pills.  If your periods are troublesome but not so heavy, lie down with your feet raised slightly above your heart. Place cold packs on your lower abdomen.   If your periods are heavy, write down the number of pads or tampons you use per month. Bring this information to your health care provider.   Include green vegetables in your diet.  SEEK IMMEDIATE MEDICAL CARE IF:  You have pelvic pain or cramps not controlled with medicines.   You have a sudden increase in pelvic pain.   You have an increase in bleeding between and during periods.   You have excessive periods and soak tampons or pads in a half hour or less.  You feel lightheaded or have fainting episodes. Document Released: 10/03/2000 Document Revised: 07/27/2013 Document Reviewed: 05/05/2013 [facility] Patient Information 2014 [place], [place]. Iron Deficiency Anemia, Adult Anemia is a condition in which there are less red blood cells or hemoglobin in the blood than normal. Hemoglobin is this part of red blood cells that carries oxygen. Iron deficiency anemia is anemia caused by too little iron. It is the most common type of anemia. It may leave you tired and short of breath. CAUSES   Lack of iron in the diet.  Poor absorption of iron, as seen with intestinal disorders.  Intestinal bleeding.  Heavy periods. SIGNS AND SYMPTOMS  Mild anemia may not be noticeable. Symptoms may include:  Fatigue.  Headache.  Pale skin.  Weakness.  Tiredness.  Shortness of breath.  Dizziness.  Cold hands and feet.  Fast or irregular heartbeat. DIAGNOSIS  Diagnosis requires a thorough evaluation and physical exam by your health care provider. Blood tests are generally used to confirm iron deficiency anemia. Additional tests may be done to find the underlying cause of your anemia. These may include:  Testing for blood in the stool  (fecal occult blood test).  A procedure to see inside the colon and rectum (colonoscopy).  A procedure to see inside the esophagus and stomach (endoscopy). TREATMENT  Iron deficiency anemia is treated by correcting the cause of the deficiency. Treatment may involve:  Adding iron-rich foods to your diet.  Taking iron supplements. Pregnant or breastfeeding women need to take extra iron, because their normal diet usually does not provide the required amount.  Taking vitamins. Vitamin C improves the absorption of iron. Your health care provider may recommend taking your iron tablets with a glass of orange juice or vitamin C supplement.  Medicines to make heavy menstrual flow lighter.  Surgery. HOME CARE INSTRUCTIONS   Take iron as directed by your health care provider.  If you cannot tolerate taking iron supplements by mouth, talk to your health care provider about taking them through a vein (intravenously) or an injection into a muscle.  For the best iron absorption, iron supplements should be taken on an empty stomach. If you cannot tolerate them on an empty stomach, you may need to take them with food.  Do not drink milk or take antacids at the same time as your iron supplements. Milk and antacids may interfere with the absorption of iron.  Iron supplements can cause constipation. Make sure to include fiber in your diet to prevent constipation. A stool softener may also be recommended.  Take vitamins as directed by your health care provider.  Eat a diet rich in iron. Foods high in iron include liver, lean beef, whole-grain bread, eggs, dried fruit, and dark green, leafy vegetables. SEEK IMMEDIATE MEDICAL CARE IF:   You faint. If this happens, do not drive. Call your local emergency services (911 in [place] if no other help is available.  You have chest pain.  You feel nauseous or vomit.  You have severe or increased shortness of breath with activity.  You feel weak.  You have  a rapid heartbeat.  You have unexplained sweating.  You become lightheaded when getting up from a chair or bed. MAKE SURE YOU:   Understand these instructions.  Will watch your condition.  Will get help right away if you are not doing well or get worse. Document Released: 10/03/2000 Document Revised: 07/27/2013 Document Reviewed: 06/13/2013 [facility] Patient Information 2014 [place]. Megestrol tablets What is this medicine? MEGESTROL (me JES trol) belongs to a class of drugs known as progestins. Megestrol tablets are used to treat advanced breast or endometrial cancer. This medicine may be used for other purposes; ask your health care provider or pharmacist if you have questions. COMMON BRAND NAME(S): Megace What should I tell my health care provider before I take this medicine? They need to know if you have any of these conditions: -adrenal gland problems -history of blood clots of the legs, lungs, or other parts of the body -diabetes -kidney disease -liver disease -stroke -an unusual or allergic reaction to megestrol, other medicines, foods, dyes, or preservatives -pregnant or trying to get pregnant -breast-feeding How should I use this medicine? Take this medicine by mouth. Follow the directions on the prescription label. Do not take your medicine more  often than directed. Take your doses at regular intervals. Do not stop taking except on the advice of your doctor or health care professional. Talk to your pediatrician regarding the use of this medicine in children. Special care may be needed. Overdosage: If you think you have taken too much of this medicine contact a poison control center or emergency room at once. NOTE: This medicine is only for you. Do not share this medicine with others. What if I miss a dose? If you miss a dose, take it as soon as you can. If it is almost time for your next dose, take only that dose. Do not take double or extra doses. What may  interact with this medicine? Do not take this medicine with any of the following medications: -dofetilide This medicine may also interact with the following medications: -carbamazepine -indinavir -phenobarbital -phenytoin -primidone -rifampin -warfarin This list may not describe all possible interactions. Give your health care provider a list of all the medicines, herbs, non-prescription drugs, or dietary supplements you use. Also tell them if you smoke, drink alcohol, or use illegal drugs. Some items may interact with your medicine. What should I watch for while using this medicine? Visit your doctor or health care professional for regular checks on your progress. Continue taking this medicine even if you feel better. It may take 2 months of regular use before you know if this medicine is working for your condition. If you are a female of child-bearing age, use an effective method of birth control while you are taking this medicine. This medicine should not be used by females who are pregnant or breast-feeding. There is a potential for serious side effects to an unborn child or to an infant. Talk to your health care professional or pharmacist for more information. If you have diabetes, this medicine may affect blood sugar levels. Check your blood sugar and talk to your doctor or health care professional if you notice changes. What side effects may I notice from receiving this medicine? Side effects that you should report to your doctor or health care professional as soon as possible: -difficulty breathing or shortness of breath -chest pain -dizziness -fluid retention -increased blood pressure -leg pain or swelling -nausea and vomiting -skin rash or itching -weakness Side effects that usually do not require medical attention (report to your doctor or health care professional if they continue or are bothersome): -breakthrough menstrual bleeding -hot flashes or flushing -increased  appetite -mood changes -sweating -weight gain This list may not describe all possible side effects. Call your doctor for medical advice about side effects. You may report side effects to FDA at 1-800-FDA-1088. Where should I keep my medicine? Keep out of the reach of children. Store at controlled room temperature between 15 and 30 degrees C (59 and 86 degrees F). Protect from heat above 40 degrees C (104 degrees F). Throw away any unused medicine after the expiration date. NOTE: This sheet is a summary. It may not cover all possible information. If you have questions about this medicine, talk to your doctor, pharmacist, or health care provider.  2014, [employer]. (2008-04-24 15:57:10)

## 2013-11-23 LAB — THROMBIN TIME: THROMBIN TIME: 14.3 seconds (ref <21.0)

## 2013-11-23 LAB — VON WILLEBRAND PANEL
Coagulation Factor VIII: 215 % — ABNORMAL HIGH (ref 73–140)
Ristocetin Co-factor, Plasma: 66 % (ref 42–200)
Von Willebrand Antigen, Plasma: 114 % (ref 50–217)

## 2013-11-23 LAB — FIBRINOGEN: FIBRINOGEN: 396 mg/dL (ref 204–475)

## 2013-11-23 LAB — APTT: APTT: 29 s (ref 24–37)

## 2013-12-05 ENCOUNTER — Other Ambulatory Visit: Payer: Self-pay | Admitting: Medical Oncology

## 2013-12-05 ENCOUNTER — Ambulatory Visit: Payer: No Typology Code available for payment source

## 2013-12-07 ENCOUNTER — Telehealth: Payer: Self-pay | Admitting: Internal Medicine

## 2013-12-07 NOTE — Telephone Encounter (Signed)
, °

## 2013-12-08 ENCOUNTER — Telehealth: Payer: Self-pay | Admitting: *Deleted

## 2013-12-08 NOTE — Telephone Encounter (Signed)
Yes we will be discussing clomiphene citrate. We did discuss potential side effects. We does talk about specific timing of starting medication. We also need to discuss blood testing after starting medication. We then discussed the timing of intercourse. There is several issues that we need to discuss. If she would like to wait for several months she can do so and meanwhile use the ovulation predictor kit to time or intercourse.

## 2013-12-08 NOTE — Telephone Encounter (Signed)
Pt informed with the below note, transferred to front desk.  

## 2013-12-08 NOTE — Telephone Encounter (Signed)
Pt informed with the below note, she would like to know if this appointment is going to be to discuss clomid medication? Pt said she has spent a lot of money on co-payments with all her doctor appointments. Please advise

## 2013-12-08 NOTE — Telephone Encounter (Signed)
Yes please

## 2013-12-08 NOTE — Telephone Encounter (Signed)
Follow up from OV note 11/10/13 pt saw hematologist oncologist Dr.Chism due to Borderline von Willebrand panel, pt said everything was fine. Pt said she discuss with you about starting on clomid? Should pt make OV with you to discuss? Please advise

## 2013-12-09 ENCOUNTER — Ambulatory Visit: Payer: No Typology Code available for payment source

## 2013-12-09 ENCOUNTER — Other Ambulatory Visit: Payer: Self-pay

## 2013-12-19 ENCOUNTER — Encounter: Payer: Self-pay | Admitting: Gynecology

## 2013-12-19 ENCOUNTER — Ambulatory Visit (INDEPENDENT_AMBULATORY_CARE_PROVIDER_SITE_OTHER): Payer: PRIVATE HEALTH INSURANCE | Admitting: Gynecology

## 2013-12-19 VITALS — BP 124/78

## 2013-12-19 DIAGNOSIS — N915 Oligomenorrhea, unspecified: Secondary | ICD-10-CM

## 2013-12-19 DIAGNOSIS — N970 Female infertility associated with anovulation: Secondary | ICD-10-CM

## 2013-12-19 MED ORDER — CLOMIPHENE CITRATE 50 MG PO TABS: 100.0000 mg | ORAL_TABLET | Freq: Every day | ORAL | Status: DC

## 2013-12-19 NOTE — Patient Instructions (Signed)
Clomiphene tablets What is this medicine? CLOMIPHENE (KLOE mi feen) is a fertility drug that increases the chance of pregnancy. It helps women ovulate (produce a mature egg) during their cycle. This medicine may be used for other purposes; ask your health care provider or pharmacist if you have questions. COMMON BRAND NAME(S): Clomid, Serophene What should I tell my health care provider before I take this medicine? They need to know if you have any of these conditions: -adrenal gland disease -blood vessel disease or blood clots -cyst on the ovary -endometriosis -liver disease -ovarian cancer -pituitary gland disease -vaginal bleeding that has not been evaluated -an unusual or allergic reaction to clomiphene, other medicines, foods, dyes, or preservatives -pregnant (should not be used if you are already pregnant) -breast-feeding How should I use this medicine? Take this medicine by mouth with a glass of water. Follow the directions on the prescription label. Take exactly as directed for the exact number of days prescribed. Take your doses at regular intervals. Most women take this medicine for a 5 day period, but the length of treatment may be adjusted. Your doctor will give you a start date for this medication and will give you instructions on proper use. Do not take your medicine more often than directed. Talk to your pediatrician regarding the use of this medicine in children. Special care may be needed. Overdosage: If you think you have taken too much of this medicine contact a poison control center or emergency room at once. NOTE: This medicine is only for you. Do not share this medicine with others. What if I miss a dose? If you miss a dose, take it as soon as you can. If it is almost time for your next dose, take only that dose. Do not take double or extra doses. What may interact with this medicine? -herbal or dietary supplements, like blue cohosh, black cohosh, chasteberry, or  DHEA -prasterone This list may not describe all possible interactions. Give your health care provider a list of all the medicines, herbs, non-prescription drugs, or dietary supplements you use. Also tell them if you smoke, drink alcohol, or use illegal drugs. Some items may interact with your medicine. What should I watch for while using this medicine? Make sure you understand how and when to use this medicine. You need to know when you are ovulating and when to have sexual intercourse. This will increase the chance of a pregnancy. Visit your doctor or health care professional for regular checks on your progress. You may need tests to check the hormone levels in your blood or you may have to use home-urine tests to check for ovulation. Try to keep any appointments. Compared to other fertility treatments, this medicine does not greatly increase your chances of having multiple babies. An increased chance of having twins may occur in roughly 5 out of every 100 women who take this medication. Stop taking this medicine at once and contact your doctor or health care professional if you think you are pregnant. This medicine is not for long-term use. Most women that benefit from this medicine do so within the first three cycles (months). Your doctor or health care professional will monitor your condition. This medicine is usually used for a total of 6 cycles of treatment. You may get drowsy or dizzy. Do not drive, use machinery, or do anything that needs mental alertness until you know how this drug affects you. Do not stand or sit up quickly. This reduces the risk of dizzy or   fainting spells. Drinking alcoholic beverages or smoking tobacco may decrease your chance of becoming pregnant. Limit or stop alcohol and tobacco use during your fertility treatments. What side effects may I notice from receiving this medicine? Side effects that you should report to your doctor or health care professional as soon as  possible: -allergic reactions like skin rash, itching or hives, swelling of the face, lips, or tongue -breathing problems -changes in vision -fluid retention -nausea, vomiting -pelvic pain or bloating -severe abdominal pain -sudden weight gain Side effects that usually do not require medical attention (report to your doctor or health care professional if they continue or are bothersome): -breast discomfort -hot flashes -mild pelvic discomfort -mild nausea This list may not describe all possible side effects. Call your doctor for medical advice about side effects. You may report side effects to FDA at 1-800-FDA-1088. Where should I keep my medicine? Keep out of the reach of children. Store at room temperature between 15 and 30 degrees C (59 and 86 degrees F). Protect from heat, light, and moisture. Throw away any unused medicine after the expiration date. NOTE: This sheet is a summary. It may not cover all possible information. If you have questions about this medicine, talk to your doctor, pharmacist, or health care provider.  2014, Elsevier/Gold Standard. (2008-01-17 22:21:06)

## 2013-12-19 NOTE — Progress Notes (Signed)
   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 Patient with prior history of 2 separate occasions she had resectoscopic polypectomy for dysfunction uterine bleeding. Patient with PCO S. syndrome history. Patient has had history of oligomenorrhea in the past. Patient several years ago had gone to only one cycle of clomiphene citrate before the endometrial polyps diagnosed by my previous partner. 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 Her ristocetin cofactor was normal at 116%. According to his note this had been attributed to her having been on Megace but he is in the process of repeated test as well as platelet function studies. She scheduled see him within the next week. Please see previous notes for additional information.  Patient has had unprotected intercourse with her partner for several years. He is in his early 20s and has had children with another partner. Patient would like to proceed with ovulation induction medication. She has lost over 40 pounds in the past year and a half by exercising and eating healthier. We had a lengthy discussion today of the risks benefits and pros and cons ovulation induction medication to include multiple gestation as high as 20% risk and also the risk of hyperstimulation syndrome.  Patient's last menstrual period was February 22 she has come off the Megace as not reporting any unusual bleeding. I've given her instructions to start with her next cycle which would be in the month of April to begin clomiphene citrate 100 mg a day 5 through 9. She will then used the ovulation predictor kit and a 12-16 of her cycle to time or intercourse. She'll return to our office on day  20 or 21 or 22 of her cycle for serum progesterone level and then to see me in consultation the week after her blood drawn. She will follow up with the oncologist next few weeks as well to complete the evaluation. If we do 3 months of clomiphene citrate and she ovulates but does not conceive we discussed proceeding then with a hysterosalpingogram to determine if tubal patency is present are not as well as a semen analysis of her partner before we proceed further. Literature and information on all the above was provided.

## 2013-12-22 ENCOUNTER — Ambulatory Visit (INDEPENDENT_AMBULATORY_CARE_PROVIDER_SITE_OTHER): Payer: PRIVATE HEALTH INSURANCE

## 2013-12-22 ENCOUNTER — Ambulatory Visit (INDEPENDENT_AMBULATORY_CARE_PROVIDER_SITE_OTHER): Payer: PRIVATE HEALTH INSURANCE | Admitting: Gynecology

## 2013-12-22 ENCOUNTER — Encounter: Payer: Self-pay | Admitting: Gynecology

## 2013-12-22 ENCOUNTER — Telehealth: Payer: Self-pay | Admitting: *Deleted

## 2013-12-22 ENCOUNTER — Other Ambulatory Visit: Payer: Self-pay | Admitting: Gynecology

## 2013-12-22 VITALS — BP 124/78

## 2013-12-22 DIAGNOSIS — R1031 Right lower quadrant pain: Secondary | ICD-10-CM

## 2013-12-22 DIAGNOSIS — R590 Localized enlarged lymph nodes: Secondary | ICD-10-CM | POA: Insufficient documentation

## 2013-12-22 DIAGNOSIS — B9689 Other specified bacterial agents as the cause of diseases classified elsewhere: Secondary | ICD-10-CM

## 2013-12-22 DIAGNOSIS — N898 Other specified noninflammatory disorders of vagina: Secondary | ICD-10-CM

## 2013-12-22 DIAGNOSIS — R102 Pelvic and perineal pain: Secondary | ICD-10-CM

## 2013-12-22 DIAGNOSIS — A499 Bacterial infection, unspecified: Secondary | ICD-10-CM

## 2013-12-22 DIAGNOSIS — N831 Corpus luteum cyst of ovary, unspecified side: Secondary | ICD-10-CM

## 2013-12-22 DIAGNOSIS — N949 Unspecified condition associated with female genital organs and menstrual cycle: Secondary | ICD-10-CM

## 2013-12-22 DIAGNOSIS — R599 Enlarged lymph nodes, unspecified: Secondary | ICD-10-CM

## 2013-12-22 DIAGNOSIS — Z113 Encounter for screening for infections with a predominantly sexual mode of transmission: Secondary | ICD-10-CM

## 2013-12-22 DIAGNOSIS — N760 Acute vaginitis: Secondary | ICD-10-CM

## 2013-12-22 LAB — WET PREP FOR TRICH, YEAST, CLUE
TRICH WET PREP: NONE SEEN
Yeast Wet Prep HPF POC: NONE SEEN

## 2013-12-22 MED ORDER — DOXYCYCLINE HYCLATE 100 MG PO CAPS: 100.0000 mg | ORAL_CAPSULE | Freq: Two times a day (BID) | ORAL | Status: DC

## 2013-12-22 MED ORDER — TRAMADOL HCL 50 MG PO TABS: 50.0000 mg | ORAL_TABLET | Freq: Four times a day (QID) | ORAL | Status: DC | PRN

## 2013-12-22 MED ORDER — METRONIDAZOLE 500 MG PO TABS: 500.0000 mg | ORAL_TABLET | Freq: Two times a day (BID) | ORAL | Status: DC

## 2013-12-22 MED ORDER — FLUCONAZOLE 150 MG PO TABS: 150.0000 mg | ORAL_TABLET | Freq: Once | ORAL | Status: DC

## 2013-12-22 NOTE — Progress Notes (Signed)
   Patient is a 28 year old presented to the office today complaining of a two-day history of right groin tenderness. Patient has otherwise done well her last menstrual cycle was approximately 4 days ago and she is in the process of starting ovulation induction medication for her infertility. She is in a steady relationship.  Exam: Right groin lymphadenopathy tender. Bartholin urethra Skene glands within normal limits Vagina: Pearline Cables, nonodorous frothy discharge was noted with hyperemic vaginal mucosa. Wet prep and GC Chlamydia culture obtained Cervix: Same Bimanual exam tenderness on both adnexa right greater than left could not fully assess her ovaries  Wet prep: Pos Amine many clue cells, 2 numerous to count bacteria, rare wbc's  An ultrasound was ordered due to the fact that we could not fully assess her adnexa. Ultrasound today:  Uterus measured 8.7 x 5.3 x 3.6 cm with endometrial stripe of 3.9 mm. A small 19 x 14 collapse right corpus luteum cyst was noted. Left ovary was normal. No fluid in the cul-de-sac. Right groin cystic mass measuring 19 x 6 mm avascular area where she was tender. Noted was avascular tubal cystic area inferior to the cyst in the right groin area.  Assessment/plan: Patient with clinical evidence of bacterial vaginosis. She will be started on Flagyl 500 mg twice a day for 7 days. We will also put her on Vibramycin 100 mg twice a day for 7 days as we await further results for Chlamydia culture in the event that her symptomatology and lymphadenopathy being  attributed to PID like picture. We will ask her to call the office after one week after completion of treatment. If her symptoms and the nodularity is still present on going to refer her to a general surgeon for biopsy and further evaluation.

## 2013-12-22 NOTE — Patient Instructions (Signed)

## 2013-12-22 NOTE — Telephone Encounter (Signed)
Pt ultram 50 mg was set on print and pharmacy never received it. I called rx into [facility], pt informed.

## 2013-12-23 LAB — GC/CHLAMYDIA PROBE AMP
CT PROBE, AMP APTIMA: NEGATIVE
GC Probe RNA: NEGATIVE

## 2014-01-09 ENCOUNTER — Encounter: Payer: Self-pay | Admitting: Gynecology

## 2014-01-09 ENCOUNTER — Ambulatory Visit (INDEPENDENT_AMBULATORY_CARE_PROVIDER_SITE_OTHER): Payer: PRIVATE HEALTH INSURANCE | Admitting: Gynecology

## 2014-01-09 VITALS — BP 128/90

## 2014-01-09 DIAGNOSIS — K589 Irritable bowel syndrome without diarrhea: Secondary | ICD-10-CM

## 2014-01-09 DIAGNOSIS — E282 Polycystic ovarian syndrome: Secondary | ICD-10-CM

## 2014-01-09 DIAGNOSIS — N970 Female infertility associated with anovulation: Secondary | ICD-10-CM

## 2014-01-09 DIAGNOSIS — N912 Amenorrhea, unspecified: Secondary | ICD-10-CM

## 2014-01-09 DIAGNOSIS — R109 Unspecified abdominal pain: Secondary | ICD-10-CM

## 2014-01-09 LAB — PREGNANCY, URINE: PREG TEST UR: NEGATIVE

## 2014-01-09 MED ORDER — MEDROXYPROGESTERONE ACETATE 10 MG PO TABS: 10.0000 mg | ORAL_TABLET | Freq: Every day | ORAL | Status: DC

## 2014-01-09 NOTE — Progress Notes (Signed)
   Patient presented to the office today for a followup from last visit on 12/22/2013 whereby patient was complaining of right groin tenderness for several days prior to that visit. Patient is in the process of trying to conceive. She was treated for bacterial vaginosis. She had an ultrasound at that office visit which demonstrated the following:  Uterus measured 8.7 x 5.3 x 3.6 cm with endometrial stripe of 3.9 mm. A small 19 x 14 collapse right corpus luteum cyst was noted. Left ovary was normal. No fluid in the cul-de-sac. Right groin cystic mass measuring 19 x 6 mm avascular area where she was tender. Noted was avascular tubal cystic area inferior to the cyst in the right groin area.  Her GC and chlamydia culture were negative. She was treated for BV with Flagyl 500 mg twice a day for 7 days. She was also started on [facility] 100 mg twice a day for 7 days as the cultures for Chlamydia and was pending. She states now that her swollen lymph node has disappeared so she was started on the above antibiotic regimen. She has colicky abdominal discomfort on and off for the past few days. She states sometimes her stools are loose.  She states her last menstrual period was approximately 30 days.  Exam: Bartholin urethra Skene was within normal limits Vagina: No lesions or discharge Cervix: No lesions or discharge Uterus: Anteverted normal size shape and consistency Adnexa: No palpable mass or tenderness Rectal exam: Not done  Assessment/plan: Problems #1 patient with past history of primary infertility/oligomenorrhea/PCOS.Patient several years ago had gone to only one cycle of clomiphene citrate before the endometrial polyps diagnosed by my previous partner. Because of her dysfunction uterine bleeding she has been followed by the oncologist Concha Norway MD as a result of her prior von Willebrand panel was ordered and demonstrated that her factor VIII level was elevated at 273 and her von Willebrand  antigen and plasma was 217% at the upper range of normal. Her ristocetin cofactor was normal at 116%. According to his note this had been attributed to her having been on Megace but he is in the process of repeated test as well as platelet function studies. Patient had been given prescription for clomiphene citrate to take 100 mg one day 5 through 9% she has not had menses yet a urine pregnancy test was done in the office today which was negative. She will be given Provera 10 mg to take 1 by mouth daily for the next 5-10 days to jump start her cycle.She'll return to our office on day 20 or 21 or 22 of her cycle for serum progesterone level and then to see me in consultation the week after her blood drawn. She will follow up with the oncologist next few weeks as well to complete the evaluation. If we do 3 months of clomiphene citrate and she ovulates but does not conceive we discussed proceeding then with a hysterosalpingogram to determine if tubal patency is present are not as well as a semen analysis of her partner before we proceed further. Problem #2 resolution of right inguinal lymphadenopathy after she was started on Flagyl Vibramycin for BV and suspected PID like picture?  Problem #3 IBS she will be started on MiraLax 1 tablespoon would choose everyday.

## 2014-01-09 NOTE — Addendum Note (Signed)
Addended by: Su Grand A on: 01/09/2014 04:24 PM   Modules accepted: Orders

## 2014-09-19 ENCOUNTER — Other Ambulatory Visit (HOSPITAL_COMMUNITY)
Admission: RE | Admit: 2014-09-19 | Discharge: 2014-09-19 | Disposition: A | Discharge status: Home / Self Care | Payer: No Typology Code available for payment source | Source: Ambulatory Visit | Attending: Nurse Practitioner | Admitting: Nurse Practitioner

## 2014-09-19 ENCOUNTER — Other Ambulatory Visit: Payer: Self-pay | Admitting: Nurse Practitioner

## 2014-09-19 DIAGNOSIS — Z01419 Encounter for gynecological examination (general) (routine) without abnormal findings: Secondary | ICD-10-CM | POA: Insufficient documentation

## 2014-09-19 DIAGNOSIS — Z113 Encounter for screening for infections with a predominantly sexual mode of transmission: Secondary | ICD-10-CM | POA: Insufficient documentation

## 2014-09-22 LAB — CYTOLOGY - PAP

## 2015-06-01 ENCOUNTER — Encounter (HOSPITAL_COMMUNITY): Payer: Self-pay | Admitting: Emergency Medicine

## 2015-06-01 ENCOUNTER — Emergency Department (HOSPITAL_COMMUNITY)
Admission: EM | Admit: 2015-06-01 | Discharge: 2015-06-01 | Disposition: A | Discharge status: Home / Self Care | Payer: PRIVATE HEALTH INSURANCE | Attending: Emergency Medicine | Admitting: Emergency Medicine

## 2015-06-01 DIAGNOSIS — Z8619 Personal history of other infectious and parasitic diseases: Secondary | ICD-10-CM | POA: Insufficient documentation

## 2015-06-01 DIAGNOSIS — K088 Other specified disorders of teeth and supporting structures: Secondary | ICD-10-CM | POA: Diagnosis present

## 2015-06-01 DIAGNOSIS — Z793 Long term (current) use of hormonal contraceptives: Secondary | ICD-10-CM | POA: Insufficient documentation

## 2015-06-01 DIAGNOSIS — Z8742 Personal history of other diseases of the female genital tract: Secondary | ICD-10-CM | POA: Insufficient documentation

## 2015-06-01 DIAGNOSIS — Z8679 Personal history of other diseases of the circulatory system: Secondary | ICD-10-CM | POA: Insufficient documentation

## 2015-06-01 DIAGNOSIS — K029 Dental caries, unspecified: Secondary | ICD-10-CM | POA: Diagnosis not present

## 2015-06-01 DIAGNOSIS — J45909 Unspecified asthma, uncomplicated: Secondary | ICD-10-CM | POA: Insufficient documentation

## 2015-06-01 DIAGNOSIS — Z792 Long term (current) use of antibiotics: Secondary | ICD-10-CM | POA: Insufficient documentation

## 2015-06-01 DIAGNOSIS — Z79899 Other long term (current) drug therapy: Secondary | ICD-10-CM | POA: Insufficient documentation

## 2015-06-01 DIAGNOSIS — Z8541 Personal history of malignant neoplasm of cervix uteri: Secondary | ICD-10-CM | POA: Insufficient documentation

## 2015-06-01 MED ORDER — HYDROCODONE-ACETAMINOPHEN 5-325 MG PO TABS: 1.0000 | ORAL_TABLET | Freq: Four times a day (QID) | ORAL | Status: DC | PRN

## 2015-06-01 NOTE — ED Provider Notes (Signed)
CSN: [id number]     Arrival date & time 06/01/15  1825 History   First MD Initiated Contact with Patient 06/01/15 1837     Chief Complaint  Patient presents with  . Dental Pain     (Consider location/radiation/quality/duration/timing/severity/associated sxs/prior Treatment) Patient is a 29 y.o. female presenting with tooth pain. The history is provided by the patient.  Dental Pain Location:  Upper Upper teeth location:  1/RU 3rd molar Quality:  Throbbing Severity:  Severe Onset quality:  Gradual Timing:  Constant Progression:  Worsening Chronicity:  New Context: abscess   Worsened by:  Jaw movement, pressure and cold food/drink Ineffective treatments:  NSAIDs Associated symptoms: facial swelling    Erika Osborne is a 29 y.o. female who presents to the ED with dental pain. The pain started several days ago. She went to her dentist yesterday and was started on Amoxicillin and Advil. She has not slept in 2 nights due to severe pain. The dentist told her she had a large cavity and the root was exposed causing her pain. Patient has a follow up scheduled with the oral surgeon in a couple weeks but the pain was so severe she came to the ED tonight.   Past Medical History  Diagnosis Date  . 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 Menometrorrhagia 12/14/2008  . STD (sexually transmitted disease) 2010    treatment for chlamydia  . Migraines   . Asthma   . Endometrial polyp    Past Surgical History  Procedure Laterality Date  . Colposcopy    . Dilation and curettage of uterus    . Hysteroscopy    . Dilatation & curettage/hysteroscopy with trueclear N/A 06/09/2013    Procedure: DILATATION & CURETTAGE/HYSTEROSCOPY WITH TRUECLEAR;  Surgeon: Terrance Mass, MD;  Location: [facility];  Service: Gynecology;  Laterality: N/A;  INSERVICE BY JASON   Family History  Problem Relation Age of Onset  . Diabetes Father   . Diabetes Paternal Grandfather    Social History    Substance Use Topics  . Smoking status: Former Smoker    Quit date: 12/15/2011  . Smokeless tobacco: None  . Alcohol Use: 0.5 oz/week    1 drink(s) per week     Comment: socially   OB History    Gravida Para Term Preterm AB TAB SAB Ectopic Multiple Living   0              Review of Systems  HENT: Positive for dental problem and facial swelling.   all other systems negative    Allergies  Review of patient's allergies indicates no known allergies.  Home Medications   Prior to Admission medications   Medication Sig Start Date End Date Taking? Authorizing Provider  albuterol (PROVENTIL HFA;VENTOLIN HFA) 108 (90 BASE) MCG/ACT inhaler Inhale 2 puffs into the lungs every 6 (six) hours as needed. For wheezing    Historical Provider, MD  clomiPHENE (CLOMID) 50 MG tablet Take 2 tablets (100 mg total) by mouth daily. 12/19/13   Terrance Mass, MD  doxycycline (VIBRAMYCIN) 100 MG capsule Take 1 capsule (100 mg total) by mouth 2 (two) times daily. Take one tablet twice a day for 1 week 12/22/13   Terrance Mass, MD  fluconazole (DIFLUCAN) 150 MG tablet Take 1 tablet (150 mg total) by mouth once. 12/22/13   Terrance Mass, MD  HYDROcodone-acetaminophen (NORCO) 5-325 MG per tablet Take 1 tablet by mouth every 6 (six) hours as needed. 06/01/15  Hope Bunnie Pion, NP  medroxyPROGESTERone (PROVERA) 10 MG tablet Take 1 tablet (10 mg total) by mouth daily. 01/09/14   Terrance Mass, MD  metroNIDAZOLE (FLAGYL) 500 MG tablet Take 1 tablet (500 mg total) by mouth 2 (two) times daily. 12/22/13   Terrance Mass, MD  traMADol (ULTRAM) 50 MG tablet Take 1 tablet (50 mg total) by mouth every 6 (six) hours as needed. 12/22/13   Terrance Mass, MD   BP 148/89 mmHg  Pulse 107  Temp(Src) 98.5 F (36.9 C) (Oral)  Resp 22  Ht 5\' 2"  (1.575 m)  Wt 179 lb (81.194 kg)  BMI 32.73 kg/m2  SpO2 100% Physical Exam  Constitutional: She is oriented to person, place, and time. She appears well-developed and  well-nourished. No distress.  Patient appears uncomfortable, she is crying.   HENT:  Right Ear: Tympanic membrane normal.  Left Ear: Tympanic membrane normal.  Nose: Nose normal.  Mouth/Throat: Uvula is midline, oropharynx is clear and moist and mucous membranes are normal.    Dental cary to right upper third molar. Tender on palpation with swelling and erythema of gum surrounding the tooth. Mild swelling to the right side of face  Eyes: Conjunctivae and EOM are normal.  Neck: Normal range of motion. Neck supple.  Cardiovascular: Normal rate and regular rhythm.   Pulmonary/Chest: Effort normal and breath sounds normal.  Abdominal: Soft. There is no tenderness.  Musculoskeletal: Normal range of motion.  Lymphadenopathy:    She has no cervical adenopathy.  Neurological: She is alert and oriented to person, place, and time. No cranial nerve deficit.  Skin: Skin is warm and dry.  Psychiatric: She has a normal mood and affect. Her behavior is normal.  Nursing note and vitals reviewed.   ED Course  Procedures (including critical care time) Labs Review Labs Reviewed - No data to display  Imaging Review.   MDM  29 y.o. female with dental pain due to caries. Will give a limited amount of hydrocodone to add to Amoxicillin and Advil that she is currently taking. She will call the oral surgeon and ask to be put on a list to call if there are any cancelled appointment. Discussed with the patient and all questioned fully answered.   Final diagnoses:  Pain due to dental caries     Ashley Murrain, NP 06/01/15 Erika Osborne  Debby Freiberg, MD 06/05/15 [phone]

## 2015-06-01 NOTE — Discharge Instructions (Signed)
Continue to take your Advil and Amoxicillin. Do not take the narcotic if driving as it will make you sleepy.  Dental Caries Dental caries is tooth decay. This decay can cause a hole in teeth (cavity) that can get bigger and deeper over time. HOME CARE  Brush and floss your teeth. Do this at least two times a day.  Use a fluoride toothpaste.  Use a mouth rinse if told by your dentist or doctor.  Eat less sugary and starchy foods. Drink less sugary drinks.  Avoid snacking often on sugary and starchy foods. Avoid sipping often on sugary drinks.  Keep regular checkups and cleanings with your dentist.  Use fluoride supplements if told by your dentist or doctor.  Allow fluoride to be applied to teeth if told by your dentist or doctor. Document Released: 07/15/2008 Document Revised: 02/20/2014 Document Reviewed: 10/08/2012 [facility] Patient Information 2015 [place], [place]. This information is not intended to replace advice given to you by your health care provider. Make sure you discuss any questions you have with your health care provider.  Dental Pain A tooth ache may be caused by cavities (tooth decay). Cavities expose the nerve of the tooth to air and hot or cold temperatures. It may come from an infection or abscess (also called a boil or furuncle) around your tooth. It is also often caused by dental caries (tooth decay). This causes the pain you are having. DIAGNOSIS  Your caregiver can diagnose this problem by exam. TREATMENT   If caused by an infection, it may be treated with medications which kill germs (antibiotics) and pain medications as prescribed by your caregiver. Take medications as directed.  Only take over-the-counter or prescription medicines for pain, discomfort, or fever as directed by your caregiver.  Whether the tooth ache today is caused by infection or dental disease, you should see your dentist as soon as possible for further care. SEEK MEDICAL CARE IF: The exam  and treatment you received today has been provided on an emergency basis only. This is not a substitute for complete medical or dental care. If your problem worsens or new problems (symptoms) appear, and you are unable to meet with your dentist, call or return to this location. SEEK IMMEDIATE MEDICAL CARE IF:   You have a fever.  You develop redness and swelling of your face, jaw, or neck.  You are unable to open your mouth.  You have severe pain uncontrolled by pain medicine. MAKE SURE YOU:   Understand these instructions.  Will watch your condition.  Will get help right away if you are not doing well or get worse. Document Released: 10/06/2005 Document Revised: 12/29/2011 Document Reviewed: 05/24/2008 [facility] Patient Information 2015 [place], [place]. This information is not intended to replace advice given to you by your health care provider. Make sure you discuss any questions you have with your health care provider.

## 2015-06-01 NOTE — ED Notes (Signed)
PA at bedside.  PA to see pt before RN assessment.

## 2015-06-01 NOTE — ED Notes (Signed)
Pt c/o severe pain in R upper wisdom tooth x 5 days.  Was seen by dentist and was told she has a deep cavity; was referred to have it removed by oral surgeon and given amoxicillin. Pt states she hasn't slept in 2 days and pain shoots up the R side of her head.  Pt tearful at this time.

## 2015-10-04 ENCOUNTER — Emergency Department (HOSPITAL_COMMUNITY)
Admission: EM | Admit: 2015-10-04 | Discharge: 2015-10-04 | Disposition: A | Discharge status: Home / Self Care | Payer: BC Managed Care – PPO | Source: Home / Self Care | Attending: Emergency Medicine | Admitting: Emergency Medicine

## 2015-10-04 ENCOUNTER — Encounter (HOSPITAL_COMMUNITY): Payer: Self-pay | Admitting: Emergency Medicine

## 2015-10-04 DIAGNOSIS — Z79899 Other long term (current) drug therapy: Secondary | ICD-10-CM | POA: Insufficient documentation

## 2015-10-04 DIAGNOSIS — J45909 Unspecified asthma, uncomplicated: Secondary | ICD-10-CM | POA: Insufficient documentation

## 2015-10-04 DIAGNOSIS — Z8742 Personal history of other diseases of the female genital tract: Secondary | ICD-10-CM | POA: Insufficient documentation

## 2015-10-04 DIAGNOSIS — B349 Viral infection, unspecified: Secondary | ICD-10-CM | POA: Insufficient documentation

## 2015-10-04 DIAGNOSIS — Z8679 Personal history of other diseases of the circulatory system: Secondary | ICD-10-CM | POA: Insufficient documentation

## 2015-10-04 DIAGNOSIS — Z87891 Personal history of nicotine dependence: Secondary | ICD-10-CM | POA: Insufficient documentation

## 2015-10-04 DIAGNOSIS — K529 Noninfective gastroenteritis and colitis, unspecified: Secondary | ICD-10-CM | POA: Diagnosis not present

## 2015-10-04 DIAGNOSIS — K2970 Gastritis, unspecified, without bleeding: Secondary | ICD-10-CM

## 2015-10-04 DIAGNOSIS — Z3202 Encounter for pregnancy test, result negative: Secondary | ICD-10-CM | POA: Insufficient documentation

## 2015-10-04 LAB — CBC WITH DIFFERENTIAL/PLATELET
Basophils Absolute: 0 10*3/uL (ref 0.0–0.1)
Basophils Relative: 0 %
EOS ABS: 0 10*3/uL (ref 0.0–0.7)
Eosinophils Relative: 0 %
HCT: 41.3 % (ref 36.0–46.0)
HEMOGLOBIN: 13 g/dL (ref 12.0–15.0)
LYMPHS ABS: 1.3 10*3/uL (ref 0.7–4.0)
LYMPHS PCT: 17 %
MCH: 25.9 pg — AB (ref 26.0–34.0)
MCHC: 31.5 g/dL (ref 30.0–36.0)
MCV: 82.3 fL (ref 78.0–100.0)
MONOS PCT: 9 %
Monocytes Absolute: 0.7 10*3/uL (ref 0.1–1.0)
Neutro Abs: 5.8 10*3/uL (ref 1.7–7.7)
Neutrophils Relative %: 74 %
Platelets: 241 10*3/uL (ref 150–400)
RBC: 5.02 MIL/uL (ref 3.87–5.11)
RDW: 13.3 % (ref 11.5–15.5)
WBC: 7.8 10*3/uL (ref 4.0–10.5)

## 2015-10-04 LAB — URINALYSIS, ROUTINE W REFLEX MICROSCOPIC
BILIRUBIN URINE: NEGATIVE
Glucose, UA: NEGATIVE mg/dL
HGB URINE DIPSTICK: NEGATIVE
Ketones, ur: >80 mg/dL — AB
Leukocytes, UA: NEGATIVE
Nitrite: NEGATIVE
PH: 5.5 (ref 5.0–8.0)
Protein, ur: 30 mg/dL — AB
SPECIFIC GRAVITY, URINE: 1.031 — AB (ref 1.005–1.030)

## 2015-10-04 LAB — COMPREHENSIVE METABOLIC PANEL
ALK PHOS: 46 U/L (ref 38–126)
ALT: 16 U/L (ref 14–54)
AST: 20 U/L (ref 15–41)
Albumin: 3.5 g/dL (ref 3.5–5.0)
Anion gap: 10 (ref 5–15)
BILIRUBIN TOTAL: 0.8 mg/dL (ref 0.3–1.2)
BUN: <5 mg/dL — ABNORMAL LOW (ref 6–20)
CALCIUM: 9.4 mg/dL (ref 8.9–10.3)
CO2: 24 mmol/L (ref 22–32)
CREATININE: 0.95 mg/dL (ref 0.44–1.00)
Chloride: 102 mmol/L (ref 101–111)
GFR calc non Af Amer: >60 mL/min (ref >60)
GLUCOSE: 96 mg/dL (ref 65–99)
Potassium: 3.9 mmol/L (ref 3.5–5.1)
SODIUM: 136 mmol/L (ref 135–145)
TOTAL PROTEIN: 7.7 g/dL (ref 6.5–8.1)

## 2015-10-04 LAB — LIPASE, BLOOD: Lipase: 28 U/L (ref 11–51)

## 2015-10-04 LAB — URINE MICROSCOPIC-ADD ON: BACTERIA UA: NONE SEEN

## 2015-10-04 LAB — PREGNANCY, URINE: Preg Test, Ur: NEGATIVE

## 2015-10-04 MED ORDER — OMEPRAZOLE 20 MG PO CPDR: 20.0000 mg | DELAYED_RELEASE_CAPSULE | Freq: Two times a day (BID) | ORAL | Status: DC

## 2015-10-04 MED ORDER — SODIUM CHLORIDE 0.9 % IV BOLUS (SEPSIS)
1000.0000 mL | Freq: Once | INTRAVENOUS | Status: AC
  Administered 2015-10-04: 1000 mL via INTRAVENOUS

## 2015-10-04 MED ORDER — PANTOPRAZOLE SODIUM 40 MG IV SOLR
40.0000 mg | Freq: Once | INTRAVENOUS | Status: AC
  Administered 2015-10-04: 40 mg via INTRAVENOUS
  Filled 2015-10-04: qty 40

## 2015-10-04 MED ORDER — MORPHINE SULFATE (PF) 4 MG/ML IV SOLN
4.0000 mg | INTRAVENOUS | Status: DC | PRN
  Administered 2015-10-04: 4 mg via INTRAVENOUS
  Filled 2015-10-04: qty 1

## 2015-10-04 MED ORDER — ONDANSETRON HCL 4 MG/2ML IJ SOLN
4.0000 mg | Freq: Once | INTRAMUSCULAR | Status: AC
  Administered 2015-10-04: 4 mg via INTRAVENOUS
  Filled 2015-10-04: qty 2

## 2015-10-04 MED ORDER — HYDROCODONE-ACETAMINOPHEN 5-325 MG PO TABS: 2.0000 | ORAL_TABLET | ORAL | Status: DC | PRN

## 2015-10-04 MED ORDER — ONDANSETRON 4 MG PO TBDP: 4.0000 mg | ORAL_TABLET | Freq: Three times a day (TID) | ORAL | Status: DC | PRN

## 2015-10-04 NOTE — ED Notes (Signed)
Dr. James MD at bedside. 

## 2015-10-04 NOTE — ED Notes (Signed)
Pt reports abdominal pain beginning yesterday.  Pt reports pain "travels", reports pain at this time LUQ.  Pt reports N/V after meals, denies diarrhea. Pt denies dysuria, vag discharge.  NAD noted at this time.

## 2015-10-04 NOTE — ED Provider Notes (Signed)
CSN: [id number]     Arrival date & time 10/04/15  [id number] History   First MD Initiated Contact with Patient 10/04/15 [phone]     Chief Complaint  Patient presents with  . Abdominal Pain      HPI  Patient presents for chief complaint of abdominal pain for the last 36 hours.  In her baseline health. 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 Went to work yesterday began feeling some nausea. Describes epigastric abdominal pain. States anything she tried to eat make her more comfortable and start having vomiting yesterday afternoon. She awakened this morning feeling slightly less pain. Tried some oatmeal. Redeveloped nausea and some vomiting. Pain is present but less than yesterday. States she felt chilled during the night and home and her blanket at home. Temp was 100.5. No diarrhea. No urinary symptoms. No pulmonary symptoms cough or URI symptoms.  Past Medical History  Diagnosis Date  . 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 Menometrorrhagia 12/14/2008  . STD (sexually transmitted disease) 2010    treatment for chlamydia  . Migraines   . Asthma   . Endometrial polyp    Past Surgical History  Procedure Laterality Date  . Colposcopy    . Dilation and curettage of uterus    . Hysteroscopy    . Dilatation & curettage/hysteroscopy with trueclear N/A 06/09/2013    Procedure: DILATATION & CURETTAGE/HYSTEROSCOPY WITH TRUECLEAR;  Surgeon: Terrance Mass, MD;  Location: [facility];  Service: Gynecology;  Laterality: N/A;  INSERVICE BY JASON   Family History  Problem Relation Age of Onset  . Diabetes Father   . Diabetes Paternal Grandfather    Social History  Substance Use Topics  . Smoking status: Former Smoker    Quit date: 12/15/2011  . Smokeless tobacco: None  . Alcohol Use: 0.5 oz/week    1 drink(s) per week     Comment: socially   OB History    Gravida Para Term Preterm AB TAB SAB Ectopic Multiple Living   0              Review of Systems  Constitutional: Positive for fever  and chills. Negative for diaphoresis, appetite change and fatigue.  HENT: Negative for mouth sores, sore throat and trouble swallowing.   Eyes: Negative for visual disturbance.  Respiratory: Negative for cough, chest tightness, shortness of breath and wheezing.   Cardiovascular: Negative for chest pain.  Gastrointestinal: Positive for nausea, vomiting and abdominal pain. Negative for diarrhea and abdominal distention.  Endocrine: Negative for polydipsia, polyphagia and polyuria.  Genitourinary: Negative for dysuria, frequency and hematuria.  Musculoskeletal: Negative for gait problem.  Skin: Negative for color change, pallor and rash.  Neurological: Negative for dizziness, syncope, light-headedness and headaches.  Hematological: Does not bruise/bleed easily.  Psychiatric/Behavioral: Negative for behavioral problems and confusion.      Allergies  Review of patient's allergies indicates no known allergies.  Home Medications   Prior to Admission medications   Medication Sig Start Date End Date Taking? Authorizing Provider  albuterol (PROVENTIL HFA;VENTOLIN HFA) 108 (90 BASE) MCG/ACT inhaler Inhale 2 puffs into the lungs every 6 (six) hours as needed. For wheezing   Yes Historical Provider, MD  phentermine (ADIPEX-P) 37.5 MG tablet Take 37.5 mg by mouth daily before breakfast.   Yes Historical Provider, MD  HYDROcodone-acetaminophen (NORCO/VICODIN) 5-325 MG tablet Take 2 tablets by mouth every 4 (four) hours as needed. 10/04/15   Tanna Furry, MD  medroxyPROGESTERone (PROVERA) 10 MG tablet Take 1 tablet (10 mg  total) by mouth daily. Patient not taking: Reported on 10/04/2015 01/09/14   Terrance Mass, MD  omeprazole (PRILOSEC) 20 MG capsule Take 1 capsule (20 mg total) by mouth 2 (two) times daily. 10/04/15   Tanna Furry, MD  ondansetron (ZOFRAN ODT) 4 MG disintegrating tablet Take 1 tablet (4 mg total) by mouth every 8 (eight) hours as needed for nausea. 10/04/15   Tanna Furry, MD  traMADol  (ULTRAM) 50 MG tablet Take 1 tablet (50 mg total) by mouth every 6 (six) hours as needed. Patient not taking: Reported on 10/04/2015 12/22/13   Terrance Mass, MD   BP 119/92 mmHg  Pulse 91  Temp(Src) 99.5 F (37.5 C) (Oral)  Resp 12  SpO2 100%  LMP 08/23/2015 (Exact Date) Physical Exam  Constitutional: She is oriented to person, place, and time. She appears well-developed and well-nourished. No distress.  HENT:  Head: Normocephalic.  Eyes: Conjunctivae are normal. Pupils are equal, round, and reactive to light. No scleral icterus.  Neck: Normal range of motion. Neck supple. No thyromegaly present.  Cardiovascular: Normal rate and regular rhythm.  Exam reveals no gallop and no friction rub.   No murmur heard. Pulmonary/Chest: Effort normal and breath sounds normal. No respiratory distress. She has no wheezes. She has no rales.  Abdominal: Soft. Bowel sounds are normal. She exhibits no distension. There is no tenderness. There is no rebound.    History tenderness. No right upper quadrant tenderness. Negative Murphy sign. Lower abdomen benign, nontender.  Musculoskeletal: Normal range of motion.  Neurological: She is alert and oriented to person, place, and time.  Skin: Skin is warm and dry. No rash noted.  Psychiatric: She has a normal mood and affect. Her behavior is normal.    ED Course  Procedures (including critical care time) Labs Review Labs Reviewed  CBC WITH DIFFERENTIAL/PLATELET - Abnormal; Notable for the following:    MCH 25.9 (*)    All other components within normal limits  COMPREHENSIVE METABOLIC PANEL - Abnormal; Notable for the following:    BUN <5 (*)    All other components within normal limits  URINALYSIS, ROUTINE W REFLEX MICROSCOPIC (NOT AT [facility]) - Abnormal; Notable for the following:    Color, Urine AMBER (*)    APPearance TURBID (*)    Specific Gravity, Urine 1.031 (*)    Ketones, ur >80 (*)    Protein, ur 30 (*)    All other components within normal  limits  URINE MICROSCOPIC-ADD ON - Abnormal; Notable for the following:    Squamous Epithelial / LPF 0-5 (*)    All other components within normal limits  LIPASE, BLOOD  PREGNANCY, URINE    Imaging Review No results found. I have personally reviewed and evaluated these images and lab results as part of my medical decision-making.   EKG Interpretation None      MDM   Final diagnoses:  Gastritis  Viral syndrome    Reassuring labs. Normal hepatobiliary, and pancreatic enzymes. Symptoms improved. Plan will be home. Expectant management. Zofran for nausea, Prilosec, when necessary Vicodin. Bland food, push fluids. ER with acute changes. Routine primary care follow-up.    Tanna Furry, MD 10/04/15 [phone]

## 2015-10-04 NOTE — Discharge Instructions (Signed)
Push liquids, stay hydrated. Bland foods. Primary care follow-up.  ER with any worsening.  Gastritis, Adult Gastritis is soreness and puffiness (inflammation) of the lining of the stomach. If you do not get help, gastritis can cause bleeding and sores (ulcers) in the stomach. HOME CARE   Only take medicine as told by your doctor.  If you were given antibiotic medicines, take them as told. Finish the medicines even if you start to feel better.  Drink enough fluids to keep your pee (urine) clear or pale yellow.  Avoid foods and drinks that make your problems worse. Foods you may want to avoid include:  Caffeine or alcohol.  Chocolate.  Mint.  Garlic and onions.  Spicy foods.  Citrus fruits, including oranges, lemons, or limes.  Food containing tomatoes, including sauce, chili, salsa, and pizza.  Fried and fatty foods.  Eat small meals throughout the day instead of large meals. GET HELP RIGHT AWAY IF:   You have black or dark red poop (stools).  You throw up (vomit) blood. It may look like coffee grounds.  You cannot keep fluids down.  Your belly (abdominal) pain gets worse.  You have a fever.  You do not feel better after 1 week.  You have any other questions or concerns. MAKE SURE YOU:   Understand these instructions.  Will watch your condition.  Will get help right away if you are not doing well or get worse.   This information is not intended to replace advice given to you by your health care provider. Make sure you discuss any questions you have with your health care provider.   Document Released: 03/24/2008 Document Revised: 12/29/2011 Document Reviewed: 11/19/2011 Elsevier Interactive Patient Education [employer].

## 2015-10-05 ENCOUNTER — Inpatient Hospital Stay (HOSPITAL_COMMUNITY)
Admission: EM | Admit: 2015-10-05 | Discharge: 2015-10-08 | DRG: 392 | Disposition: A | Discharge status: Home / Self Care | Payer: BC Managed Care – PPO | Attending: Internal Medicine | Admitting: Internal Medicine

## 2015-10-05 ENCOUNTER — Emergency Department (HOSPITAL_COMMUNITY): Payer: BC Managed Care – PPO

## 2015-10-05 ENCOUNTER — Encounter (HOSPITAL_COMMUNITY): Payer: Self-pay | Admitting: Emergency Medicine

## 2015-10-05 DIAGNOSIS — G43909 Migraine, unspecified, not intractable, without status migrainosus: Secondary | ICD-10-CM | POA: Diagnosis present

## 2015-10-05 DIAGNOSIS — Z793 Long term (current) use of hormonal contraceptives: Secondary | ICD-10-CM

## 2015-10-05 DIAGNOSIS — Z87891 Personal history of nicotine dependence: Secondary | ICD-10-CM

## 2015-10-05 DIAGNOSIS — E669 Obesity, unspecified: Secondary | ICD-10-CM | POA: Diagnosis present

## 2015-10-05 DIAGNOSIS — G43A0 Cyclical vomiting, not intractable: Secondary | ICD-10-CM

## 2015-10-05 DIAGNOSIS — Z6832 Body mass index (BMI) 32.0-32.9, adult: Secondary | ICD-10-CM

## 2015-10-05 DIAGNOSIS — K6389 Other specified diseases of intestine: Secondary | ICD-10-CM | POA: Diagnosis not present

## 2015-10-05 DIAGNOSIS — K529 Noninfective gastroenteritis and colitis, unspecified: Secondary | ICD-10-CM | POA: Diagnosis not present

## 2015-10-05 DIAGNOSIS — Z79899 Other long term (current) drug therapy: Secondary | ICD-10-CM

## 2015-10-05 DIAGNOSIS — R1115 Cyclical vomiting syndrome unrelated to migraine: Secondary | ICD-10-CM | POA: Insufficient documentation

## 2015-10-05 DIAGNOSIS — N879 Dysplasia of cervix uteri, unspecified: Secondary | ICD-10-CM | POA: Diagnosis present

## 2015-10-05 DIAGNOSIS — E282 Polycystic ovarian syndrome: Secondary | ICD-10-CM | POA: Diagnosis present

## 2015-10-05 DIAGNOSIS — K589 Irritable bowel syndrome without diarrhea: Secondary | ICD-10-CM | POA: Diagnosis present

## 2015-10-05 HISTORY — DX: Personal history of other diseases of the digestive system: Z87.19

## 2015-10-05 HISTORY — DX: Anxiety disorder, unspecified: F41.9

## 2015-10-05 LAB — COMPREHENSIVE METABOLIC PANEL
ALK PHOS: 41 U/L (ref 38–126)
ALT: 14 U/L (ref 14–54)
AST: 18 U/L (ref 15–41)
Albumin: 3.2 g/dL — ABNORMAL LOW (ref 3.5–5.0)
Anion gap: 7 (ref 5–15)
BUN: <5 mg/dL — ABNORMAL LOW (ref 6–20)
CALCIUM: 8.9 mg/dL (ref 8.9–10.3)
CO2: 26 mmol/L (ref 22–32)
CREATININE: 0.84 mg/dL (ref 0.44–1.00)
Chloride: 103 mmol/L (ref 101–111)
GFR calc non Af Amer: >60 mL/min (ref >60)
GLUCOSE: 118 mg/dL — AB (ref 65–99)
Potassium: 4.1 mmol/L (ref 3.5–5.1)
SODIUM: 136 mmol/L (ref 135–145)
Total Bilirubin: 1 mg/dL (ref 0.3–1.2)
Total Protein: 6.9 g/dL (ref 6.5–8.1)

## 2015-10-05 LAB — CBC
HCT: 37.3 % (ref 36.0–46.0)
HEMOGLOBIN: 12 g/dL (ref 12.0–15.0)
MCH: 26.5 pg (ref 26.0–34.0)
MCHC: 32.2 g/dL (ref 30.0–36.0)
MCV: 82.3 fL (ref 78.0–100.0)
PLATELETS: 229 10*3/uL (ref 150–400)
RBC: 4.53 MIL/uL (ref 3.87–5.11)
RDW: 13.2 % (ref 11.5–15.5)
WBC: 8.4 10*3/uL (ref 4.0–10.5)

## 2015-10-05 LAB — URINALYSIS, ROUTINE W REFLEX MICROSCOPIC
Glucose, UA: NEGATIVE mg/dL
Ketones, ur: 15 mg/dL — AB
Leukocytes, UA: NEGATIVE
Nitrite: NEGATIVE
PH: 5.5 (ref 5.0–8.0)
Protein, ur: 30 mg/dL — AB
SPECIFIC GRAVITY, URINE: 1.025 (ref 1.005–1.030)

## 2015-10-05 LAB — CBC WITH DIFFERENTIAL/PLATELET
BASOS PCT: 0 %
Basophils Absolute: 0 10*3/uL (ref 0.0–0.1)
EOS ABS: 0 10*3/uL (ref 0.0–0.7)
EOS PCT: 0 %
HCT: 40.6 % (ref 36.0–46.0)
HEMOGLOBIN: 13 g/dL (ref 12.0–15.0)
LYMPHS ABS: 1.4 10*3/uL (ref 0.7–4.0)
Lymphocytes Relative: 14 %
MCH: 26.2 pg (ref 26.0–34.0)
MCHC: 32 g/dL (ref 30.0–36.0)
MCV: 81.9 fL (ref 78.0–100.0)
MONO ABS: 1 10*3/uL (ref 0.1–1.0)
MONOS PCT: 10 %
Neutro Abs: 7.6 10*3/uL (ref 1.7–7.7)
Neutrophils Relative %: 76 %
Platelets: 248 10*3/uL (ref 150–400)
RBC: 4.96 MIL/uL (ref 3.87–5.11)
RDW: 13.2 % (ref 11.5–15.5)
WBC: 10 10*3/uL (ref 4.0–10.5)

## 2015-10-05 LAB — CREATININE, SERUM
CREATININE: 0.82 mg/dL (ref 0.44–1.00)
GFR calc Af Amer: >60 mL/min (ref >60)

## 2015-10-05 LAB — URINE MICROSCOPIC-ADD ON

## 2015-10-05 LAB — LACTIC ACID, PLASMA: Lactic Acid, Venous: 0.6 mmol/L (ref 0.5–2.0)

## 2015-10-05 LAB — LIPASE, BLOOD: Lipase: 32 U/L (ref 11–51)

## 2015-10-05 MED ORDER — MORPHINE SULFATE (PF) 4 MG/ML IV SOLN
4.0000 mg | Freq: Once | INTRAVENOUS | Status: AC
  Administered 2015-10-05: 4 mg via INTRAVENOUS
  Filled 2015-10-05: qty 1

## 2015-10-05 MED ORDER — HYDROMORPHONE HCL 1 MG/ML IJ SOLN
1.0000 mg | Freq: Once | INTRAMUSCULAR | Status: AC
  Administered 2015-10-05: 1 mg via INTRAVENOUS
  Filled 2015-10-05: qty 1

## 2015-10-05 MED ORDER — ACETAMINOPHEN 650 MG RE SUPP: 650.0000 mg | Freq: Four times a day (QID) | RECTAL | Status: DC | PRN

## 2015-10-05 MED ORDER — ALUM & MAG HYDROXIDE-SIMETH 200-200-20 MG/5ML PO SUSP
30.0000 mL | Freq: Four times a day (QID) | ORAL | Status: DC | PRN
  Administered 2015-10-05: 30 mL via ORAL
  Filled 2015-10-05: qty 30

## 2015-10-05 MED ORDER — ENOXAPARIN SODIUM 40 MG/0.4ML ~~LOC~~ SOLN
40.0000 mg | SUBCUTANEOUS | Status: DC
  Administered 2015-10-05 – 2015-10-07 (×3): 40 mg via SUBCUTANEOUS
  Filled 2015-10-05 (×4): qty 0.4

## 2015-10-05 MED ORDER — ONDANSETRON HCL 4 MG/2ML IJ SOLN: 4.0000 mg | Freq: Four times a day (QID) | INTRAMUSCULAR | Status: DC | PRN

## 2015-10-05 MED ORDER — METRONIDAZOLE IN NACL 5-0.79 MG/ML-% IV SOLN
500.0000 mg | Freq: Three times a day (TID) | INTRAVENOUS | Status: DC
  Administered 2015-10-05 – 2015-10-08 (×8): 500 mg via INTRAVENOUS
  Filled 2015-10-05 (×10): qty 100

## 2015-10-05 MED ORDER — METRONIDAZOLE IN NACL 5-0.79 MG/ML-% IV SOLN
500.0000 mg | Freq: Once | INTRAVENOUS | Status: DC
  Filled 2015-10-05: qty 100

## 2015-10-05 MED ORDER — CIPROFLOXACIN IN D5W 400 MG/200ML IV SOLN
400.0000 mg | Freq: Two times a day (BID) | INTRAVENOUS | Status: DC
  Administered 2015-10-06 – 2015-10-08 (×5): 400 mg via INTRAVENOUS
  Filled 2015-10-05 (×6): qty 200

## 2015-10-05 MED ORDER — IOHEXOL 300 MG/ML  SOLN
100.0000 mL | Freq: Once | INTRAMUSCULAR | Status: AC | PRN
  Administered 2015-10-05: 100 mL via INTRAVENOUS

## 2015-10-05 MED ORDER — INFLUENZA VAC SPLIT QUAD 0.5 ML IM SUSY: 0.5000 mL | PREFILLED_SYRINGE | INTRAMUSCULAR | Status: DC

## 2015-10-05 MED ORDER — ONDANSETRON HCL 4 MG PO TABS: 4.0000 mg | ORAL_TABLET | Freq: Four times a day (QID) | ORAL | Status: DC | PRN

## 2015-10-05 MED ORDER — GI COCKTAIL ~~LOC~~
30.0000 mL | Freq: Once | ORAL | Status: AC
  Administered 2015-10-05: 30 mL via ORAL
  Filled 2015-10-05: qty 30

## 2015-10-05 MED ORDER — ALBUTEROL SULFATE (2.5 MG/3ML) 0.083% IN NEBU: 3.0000 mL | INHALATION_SOLUTION | Freq: Four times a day (QID) | RESPIRATORY_TRACT | Status: DC | PRN

## 2015-10-05 MED ORDER — SODIUM CHLORIDE 0.9 % IV BOLUS (SEPSIS)
2000.0000 mL | Freq: Once | INTRAVENOUS | Status: AC
  Administered 2015-10-05: 2000 mL via INTRAVENOUS

## 2015-10-05 MED ORDER — SUCRALFATE 1 G PO TABS
1.0000 g | ORAL_TABLET | Freq: Once | ORAL | Status: AC
  Administered 2015-10-05: 1 g via ORAL
  Filled 2015-10-05: qty 1

## 2015-10-05 MED ORDER — SODIUM CHLORIDE 0.9 % IV SOLN
INTRAVENOUS | Status: DC
Start: 2015-10-05 — End: 2015-10-08
  Administered 2015-10-05 – 2015-10-07 (×3): via INTRAVENOUS

## 2015-10-05 MED ORDER — ACETAMINOPHEN 325 MG PO TABS: 650.0000 mg | ORAL_TABLET | Freq: Four times a day (QID) | ORAL | Status: DC | PRN

## 2015-10-05 MED ORDER — CIPROFLOXACIN IN D5W 400 MG/200ML IV SOLN
400.0000 mg | Freq: Once | INTRAVENOUS | Status: AC
  Administered 2015-10-05: 400 mg via INTRAVENOUS
  Filled 2015-10-05: qty 200

## 2015-10-05 MED ORDER — MORPHINE SULFATE (PF) 2 MG/ML IV SOLN
2.0000 mg | INTRAVENOUS | Status: DC | PRN
  Administered 2015-10-05 – 2015-10-06 (×4): 2 mg via INTRAVENOUS
  Filled 2015-10-05 (×4): qty 1

## 2015-10-05 MED ORDER — ONDANSETRON HCL 4 MG/2ML IJ SOLN
4.0000 mg | Freq: Once | INTRAMUSCULAR | Status: AC
  Administered 2015-10-05: 4 mg via INTRAVENOUS
  Filled 2015-10-05: qty 2

## 2015-10-05 NOTE — ED Provider Notes (Signed)
CSN: [id number]     Arrival date & time 10/05/15  0808 History   First MD Initiated Contact with Patient 10/05/15 0815     Chief Complaint  Patient presents with  . Abdominal Pain     (Consider location/radiation/quality/duration/timing/severity/associated sxs/prior Treatment) HPI Patient presents with epigastric and left upper quadrant pain starting on Wednesday. Pain radiates to the left flank. Associated with nausea and had several episodes of vomiting when trying to eat yesterday. Normal bowel movement this morning. No blood or melena. No fever or chills. Patient states she had similar symptoms with a previous peptic ulcer. Denies any Nsaid use. Started her period today. No vaginal discharge. Seen yesterday for similar symptoms. Started on Prilosec and Zofran for gastritis.   Past Medical History  Diagnosis Date  . 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 Menometrorrhagia 12/14/2008  . STD (sexually transmitted disease) 2010    treatment for chlamydia  . Migraines   . Asthma   . Endometrial polyp    Past Surgical History  Procedure Laterality Date  . Colposcopy    . Dilation and curettage of uterus    . Hysteroscopy    . Dilatation & curettage/hysteroscopy with trueclear N/A 06/09/2013    Procedure: DILATATION & CURETTAGE/HYSTEROSCOPY WITH TRUECLEAR;  Surgeon: Terrance Mass, MD;  Location: [facility];  Service: Gynecology;  Laterality: N/A;  INSERVICE BY JASON   Family History  Problem Relation Age of Onset  . Diabetes Father   . Diabetes Paternal Grandfather    Social History  Substance Use Topics  . Smoking status: Former Smoker    Quit date: 12/15/2011  . Smokeless tobacco: None  . Alcohol Use: 0.5 oz/week    1 drink(s) per week     Comment: socially   OB History    Gravida Para Term Preterm AB TAB SAB Ectopic Multiple Living   0              Review of Systems  Constitutional: Negative for fever and chills.  Respiratory: Negative for  shortness of breath.   Cardiovascular: Negative for chest pain.  Gastrointestinal: Positive for nausea, vomiting and abdominal pain. Negative for diarrhea, constipation and blood in stool.  Genitourinary: Positive for flank pain and vaginal bleeding. Negative for vaginal discharge and pelvic pain.  Musculoskeletal: Positive for back pain. Negative for neck pain and neck stiffness.  Skin: Negative for rash and wound.  Neurological: Negative for dizziness, weakness, light-headedness, numbness and headaches.  All other systems reviewed and are negative.     Allergies  Review of patient's allergies indicates no known allergies.  Home Medications   Prior to Admission medications   Medication Sig Start Date End Date Taking? Authorizing Provider  omeprazole (PRILOSEC) 20 MG capsule Take 1 capsule (20 mg total) by mouth 2 (two) times daily. 10/04/15  Yes Tanna Furry, MD  ondansetron (ZOFRAN ODT) 4 MG disintegrating tablet Take 1 tablet (4 mg total) by mouth every 8 (eight) hours as needed for nausea. 10/04/15  Yes Tanna Furry, MD  albuterol (PROVENTIL HFA;VENTOLIN HFA) 108 (90 BASE) MCG/ACT inhaler Inhale 2 puffs into the lungs every 6 (six) hours as needed. For wheezing    Historical Provider, MD  HYDROcodone-acetaminophen (NORCO/VICODIN) 5-325 MG tablet Take 2 tablets by mouth every 4 (four) hours as needed. Patient not taking: Reported on 10/05/2015 10/04/15   Tanna Furry, MD  medroxyPROGESTERone (PROVERA) 10 MG tablet Take 1 tablet (10 mg total) by mouth daily. Patient not taking: Reported on  10/04/2015 01/09/14   Terrance Mass, MD  phentermine (ADIPEX-P) 37.5 MG tablet Take 37.5 mg by mouth daily before breakfast.    Historical Provider, MD  traMADol (ULTRAM) 50 MG tablet Take 1 tablet (50 mg total) by mouth every 6 (six) hours as needed. Patient not taking: Reported on 10/04/2015 12/22/13   Terrance Mass, MD   BP 129/88 mmHg  Pulse 95  Temp(Src) 99.4 F (37.4 C) (Oral)  Resp 18  Ht  5\' 2"  (1.575 m)  Wt 180 lb (81.647 kg)  BMI 32.91 kg/m2  SpO2 97%  LMP 10/05/2015 Physical Exam  Constitutional: She is oriented to person, place, and time. She appears well-developed and well-nourished.  Patient appears uncomfortable  HENT:  Head: Normocephalic and atraumatic.  Mouth/Throat: Oropharynx is clear and moist.  Eyes: EOM are normal. Pupils are equal, round, and reactive to light.  Neck: Normal range of motion. Neck supple.  Cardiovascular: Normal rate and regular rhythm.   Pulmonary/Chest: Effort normal and breath sounds normal. No respiratory distress. She has no wheezes. She has no rales. She exhibits no tenderness.  Abdominal: Soft. Bowel sounds are normal. She exhibits no distension. There is tenderness (tenderness to palpation in the epigastric region, left upper quadrant and left lower quadrants.). There is no rebound and no guarding.  Musculoskeletal: Normal range of motion. She exhibits no edema or tenderness.  Left CVA tenderness to percussion.  Neurological: She is alert and oriented to person, place, and time.  Results from this without deficit. Sensation is fully intact.  Skin: Skin is warm and dry. No rash noted. No erythema.  Psychiatric: She has a normal mood and affect. Her behavior is normal.  Nursing note and vitals reviewed.   ED Course  Procedures (including critical care time) Labs Review Labs Reviewed  COMPREHENSIVE METABOLIC PANEL - Abnormal; Notable for the following:    Glucose, Bld 118 (*)    BUN <5 (*)    Albumin 3.2 (*)    All other components within normal limits  URINALYSIS, ROUTINE W REFLEX MICROSCOPIC (NOT AT [facility]) - Abnormal; Notable for the following:    Color, Urine AMBER (*)    APPearance TURBID (*)    Hgb urine dipstick LARGE (*)    Bilirubin Urine SMALL (*)    Ketones, ur 15 (*)    Protein, ur 30 (*)    All other components within normal limits  URINE MICROSCOPIC-ADD ON - Abnormal; Notable for the following:    Squamous  Epithelial / LPF 6-30 (*)    Bacteria, UA MANY (*)    All other components within normal limits  CBC WITH DIFFERENTIAL/PLATELET  LIPASE, BLOOD    Imaging Review US Abdomen Complete  10/05/2015  CLINICAL DATA:  Epigastric pain. EXAM: ULTRASOUND ABDOMEN COMPLETE COMPARISON:  None. FINDINGS: Gallbladder: No gallstones or wall thickening visualized. No sonographic Murphy sign noted. Common bile duct: Diameter: 4.9 mm Liver: No focal lesion identified. Within normal limits in parenchymal echogenicity. IVC: No abnormality visualized. Pancreas: Visualized portion unremarkable. Spleen: Size and appearance within normal limits. Right Kidney: Length: 9.8 cm. Echogenicity within normal limits. No mass or hydronephrosis visualized. Left Kidney: Length: 9.5 cm. Echogenicity within normal limits. No mass or hydronephrosis visualized. Abdominal aorta: No aneurysm visualized. Other findings: None. IMPRESSION: Normal abdominal ultrasound. Electronically Signed   By: Kathreen Devoid   On: 10/05/2015 10:44   Ct Abdomen Pelvis W Contrast  10/05/2015  CLINICAL DATA:  29 year old female with left-sided abdominal and pelvic pain for 1  day. EXAM: CT ABDOMEN AND PELVIS WITH CONTRAST TECHNIQUE: Multidetector CT imaging of the abdomen and pelvis was performed using the standard protocol following bolus administration of intravenous contrast. CONTRAST:  155mL OMNIPAQUE IOHEXOL 300 MG/ML  SOLN COMPARISON:  04/01/2009 abdominal ultrasound. FINDINGS: Lower chest:  Unremarkable Hepatobiliary: The liver and gallbladder are unremarkable. There is no evidence of biliary dilatation. Pancreas: Unremarkable Spleen: Unremarkable Adrenals/Urinary Tract: The kidneys, adrenal glands and bladder are unremarkable. Stomach/Bowel: There is mild wall thickening of multiple proximal and mid small bowel loops and equivocal wall thickening of the descending colon. There is no evidence of bowel obstruction, pneumoperitoneum or abscess. A small amount of  ascites within the abdomen and pelvis noted is well is mesenteric edema. Gas in the appendix is noted. Vascular/Lymphatic: No enlarged lymph nodes or abdominal aortic aneurysm. The visualized mesenteric arteries and veins are patent. Reproductive: The uterus and adnexal regions are unremarkable except for a 2.8 cm right ovarian cyst/follicle. Other: None. Musculoskeletal: No acute or suspicious abnormality. IMPRESSION: Wall thickening of multiple proximal and mid small bowel loops and the descending colon with mesenteric edema and small amount of ascites, suggesting enteritis and colitis. No evidence of bowel obstruction, pneumoperitoneum or focal abscess. Electronically Signed   By: Margarette Canada M.D.   On: 10/05/2015 14:02   I have personally reviewed and evaluated these images and lab results as part of my medical decision-making.   EKG Interpretation None      MDM   Final diagnoses:  Colitis    Patient with persistent pain despite multiple doses of medication. Ultrasound without any evidence of bladder disease. Mild elevation in white blood cell count compared to yesterday. We dosed pain medicines and get CT abdomen and pelvis.  Diffuse enteritis and colitis. Infectious versus inflammatory. IV to start in the emergency department. Discussed with [facility] hospitalist and we'll admit to observation Altha bed  Julianne Rice, MD 10/05/15 1446

## 2015-10-05 NOTE — Progress Notes (Signed)
Upon initial assessment, no bowel sounds were heard in any quadrant after multiple lengthy attempts listening.  Patient stated BM yesterday, no gas passed today.  Patient observed to be menstruating.  Abdomen is extremely painful, morphine administered upon arrival.  MD notified.

## 2015-10-05 NOTE — ED Notes (Signed)
Pt arrives from home reporting abdominal pain LUQ.  Pt states, It feels like I'm carrying bricks.  Pt reports being able to keep some food, denies recent vomiting.  Pt seen yesterday for same.  Pt tearful.

## 2015-10-05 NOTE — ED Notes (Signed)
Patient transported to Ultrasound 

## 2015-10-05 NOTE — ED Notes (Signed)
Patient transported to CT 

## 2015-10-05 NOTE — H&P (Signed)
[facility] History and Physical  Erika Osborne [id number] DOB: [birth year] DOA: 10/05/2015  Referring physician: Dr. Lita Mains PCP: Lynne Logan, MD   Chief Complaint: Abdominal pain since 2 days  HPI:  29 year old healthy female with history of migraines, asthma, endometrial polyp,  CIN 1 presented to the ED with diffuse abdominal pain for past 2 days. She reports acute onset of abdominal pain mainly involving the epigastric and periumbilical area with spasms. He reports pain to be 10/10 in severity and radiating to the back. This was associated with an episode of vomiting on the first day. She came to the ED yesterday and was found to have fever of 100.77F. Her vitals were stable with normal labs. Symptoms improved after receiving Zofran, Prilosec and Vicodin in the ED. She returned to the ED today as her symptoms persisted and worsened. She denies any further vomiting but has noticed increased abdominal bloating, or some subjective fevers but no chills, diarrhea or bleeding per rectum. Denies any dysuria, chest pain, palpitations, shortness of breath, dizziness, headache or rash. Denied recent travel or being on any new medication. Denies family history of inflammatory bowel disease or malignancy. 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 She reports that she remotely had similar symptoms about 6-7 years back when she came to the ED ,  resolved with some medications and did not require hospitalization.   Course in the ED Patient had low-grade temperature of 99.60 Fahrenheit, was mildly tachycardic in 110s, normal blood pressure. Blood will show WBC of 10 (increased from 7.8 yesterday), normal hemoglobin and platelets. Chemistry was unremarkable including LFTs and normal lipase. Ultrasound of her abdomen was unremarkable. A CT scan of the abdomen and pelvis was done which showed wall thickening of multiple proximal and mid  small bowel loops and descending colon with mesenteric edema and small amount of ascites suggestive of enteritis and colitis. No bowel obstruction pneumoperitoneum or focal abscess is noted.   Review of Systems:  Constitutional: Denies  chills, diaphoresis, fever+, poor appetite  and fatigue.  HEENT: Denies visual or hearing symptoms, trouble swallowing, sore throat, rhinorrhea,  or neck stiffness Respiratory: Denies SOB, DOE, cough, chest tightness,  and wheezing.   Cardiovascular: Denies chest pain, palpitations and leg swelling.  Gastrointestinal:  nausea, vomiting, abdominal pain, abdominal distention, denies diarrhea, constipation, blood in stool and abdominal distention.  Genitourinary: Denies dysuria, urgency, frequency, hematuria, flank pain and difficulty urinating.  Endocrine: Denies: hot or cold intolerance, sweats,polyuria, polydipsia. Musculoskeletal: Denies myalgias, back pain, joint swelling, arthralgias and gait problem.  Skin: Denies pallor, rash and wound.  Neurological: Denies dizziness, seizures, syncope, weakness, light-headedness, numbness and headaches.  Hematological: Denies adenopathy.  Psychiatric/Behavioral: Denies confusion   Past Medical History  Diagnosis Date  . 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 Menometrorrhagia 12/14/2008  . STD (sexually transmitted disease) 2010    treatment for chlamydia  . Migraines   . Asthma   . Endometrial polyp    Past Surgical History  Procedure Laterality Date  . Colposcopy    . Dilation and curettage of uterus    . Hysteroscopy    . Dilatation & curettage/hysteroscopy with trueclear N/A 06/09/2013    Procedure: DILATATION & CURETTAGE/HYSTEROSCOPY WITH TRUECLEAR;  Surgeon: Terrance Mass, MD;  Location: [facility];  Service: Gynecology;  Laterality: N/A;  INSERVICE BY JASON   Social History:  reports that she quit smoking about 3 years ago. She does not have any  smokeless tobacco history on file. She  reports that she drinks about 0.5 oz of alcohol per week. She reports that she does not use illicit drugs.  No Known Allergies  Family History  Problem Relation Age of Onset  . Diabetes Father   . Diabetes Paternal Grandfather     Prior to Admission medications   Medication Sig Start Date End Date Taking? Authorizing Provider  omeprazole (PRILOSEC) 20 MG capsule Take 1 capsule (20 mg total) by mouth 2 (two) times daily. 10/04/15  Yes Tanna Furry, MD  ondansetron (ZOFRAN ODT) 4 MG disintegrating tablet Take 1 tablet (4 mg total) by mouth every 8 (eight) hours as needed for nausea. 10/04/15  Yes Tanna Furry, MD  albuterol (PROVENTIL HFA;VENTOLIN HFA) 108 (90 BASE) MCG/ACT inhaler Inhale 2 puffs into the lungs every 6 (six) hours as needed. For wheezing    Historical Provider, MD  HYDROcodone-acetaminophen (NORCO/VICODIN) 5-325 MG tablet Take 2 tablets by mouth every 4 (four) hours as needed. Patient not taking: Reported on 10/05/2015 10/04/15   Tanna Furry, MD  medroxyPROGESTERone (PROVERA) 10 MG tablet Take 1 tablet (10 mg total) by mouth daily. Patient not taking: Reported on 10/04/2015 01/09/14   Terrance Mass, MD  phentermine (ADIPEX-P) 37.5 MG tablet Take 37.5 mg by mouth daily before breakfast.    Historical Provider, MD  traMADol (ULTRAM) 50 MG tablet Take 1 tablet (50 mg total) by mouth every 6 (six) hours as needed. Patient not taking: Reported on 10/04/2015 12/22/13   Terrance Mass, MD     Physical Exam:  Filed Vitals:   10/05/15 0930 10/05/15 0945 10/05/15 1215 10/05/15 1245  BP: 124/82 122/84 124/63 129/88  Pulse: 92 96 116 95  Temp:      TempSrc:      Resp:      Height:      Weight:      SpO2: 98% 98% 100% 97%    Constitutional: Vital signs reviewed.  Young female not in distress HEENT: no pallor, no icterus, dry oral mucosa, no cervical lymphadenopathy Cardiovascular: RRR, S1 and S2 tachycardic, no MRG Chest: CTAB, no wheezes, rales, or rhonchi Abdominal: Soft.  Mild distention, tenderness over epigastric and bilateral mid quadrant above the umbilicus bowel sounds present, no guarding or rigidity GU: no CVA tenderness Ext: warm, no edema Neurological: Alert and oriented  Labs on Admission:  Basic Metabolic Panel:  Recent Labs Lab 10/04/15 1008 10/05/15 0836  NA 136 136  K 3.9 4.1  CL 102 103  CO2 24 26  GLUCOSE 96 118*  BUN <5* <5*  CREATININE 0.95 0.84  CALCIUM 9.4 8.9   Liver Function Tests:  Recent Labs Lab 10/04/15 1008 10/05/15 0836  AST 20 18  ALT 16 14  ALKPHOS 46 41  BILITOT 0.8 1.0  PROT 7.7 6.9  ALBUMIN 3.5 3.2*    Recent Labs Lab 10/04/15 1008 10/05/15 0836  LIPASE 28 32   No results for input(s): AMMONIA in the last 168 hours. CBC:  Recent Labs Lab 10/04/15 1008 10/05/15 0836  WBC 7.8 10.0  NEUTROABS 5.8 7.6  HGB 13.0 13.0  HCT 41.3 40.6  MCV 82.3 81.9  PLT 241 248   Cardiac Enzymes: No results for input(s): CKTOTAL, CKMB, CKMBINDEX, TROPONINI in the last 168 hours. BNP: Invalid input(s): POCBNP CBG: No results for input(s): GLUCAP in the last 168 hours.  Radiological Exams on Admission: US Abdomen Complete  10/05/2015  CLINICAL DATA:  Epigastric pain. EXAM: ULTRASOUND ABDOMEN COMPLETE COMPARISON:  None. FINDINGS: Gallbladder: No gallstones or wall thickening visualized. No sonographic Murphy sign noted. Common bile duct: Diameter: 4.9 mm Liver: No focal lesion identified. Within normal limits in parenchymal echogenicity. IVC: No abnormality visualized. Pancreas: Visualized portion unremarkable. Spleen: Size and appearance within normal limits. Right Kidney: Length: 9.8 cm. Echogenicity within normal limits. No mass or hydronephrosis visualized. Left Kidney: Length: 9.5 cm. Echogenicity within normal limits. No mass or hydronephrosis visualized. Abdominal aorta: No aneurysm visualized. Other findings: None. IMPRESSION: Normal abdominal ultrasound. Electronically Signed   By: Kathreen Devoid   On:  10/05/2015 10:44   Ct Abdomen Pelvis W Contrast  10/05/2015  CLINICAL DATA:  29 year old female with left-sided abdominal and pelvic pain for 1 day. EXAM: CT ABDOMEN AND PELVIS WITH CONTRAST TECHNIQUE: Multidetector CT imaging of the abdomen and pelvis was performed using the standard protocol following bolus administration of intravenous contrast. CONTRAST:  133mL OMNIPAQUE IOHEXOL 300 MG/ML  SOLN COMPARISON:  04/01/2009 abdominal ultrasound. FINDINGS: Lower chest:  Unremarkable Hepatobiliary: The liver and gallbladder are unremarkable. There is no evidence of biliary dilatation. Pancreas: Unremarkable Spleen: Unremarkable Adrenals/Urinary Tract: The kidneys, adrenal glands and bladder are unremarkable. Stomach/Bowel: There is mild wall thickening of multiple proximal and mid small bowel loops and equivocal wall thickening of the descending colon. There is no evidence of bowel obstruction, pneumoperitoneum or abscess. A small amount of ascites within the abdomen and pelvis noted is well is mesenteric edema. Gas in the appendix is noted. Vascular/Lymphatic: No enlarged lymph nodes or abdominal aortic aneurysm. The visualized mesenteric arteries and veins are patent. Reproductive: The uterus and adnexal regions are unremarkable except for a 2.8 cm right ovarian cyst/follicle. Other: None. Musculoskeletal: No acute or suspicious abnormality. IMPRESSION: Wall thickening of multiple proximal and mid small bowel loops and the descending colon with mesenteric edema and small amount of ascites, suggesting enteritis and colitis. No evidence of bowel obstruction, pneumoperitoneum or focal abscess. Electronically Signed   By: Margarette Canada M.D.   On: 10/05/2015 14:02    EKG: None  Assessment/Plan  Principal Problem:   Acute enterocolitis Likely infectious versus? Inflammatory. 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 Admit to MedSurg. Empiric ciprofloxacin and Flagyl. Labs unremarkable with  normal LFTs and negative lipase. Check lactic acid. -Supportive care with IV morphine for pain control, antiemetics, Maalox and Tylenol. IV hydration with normal saline. -Patient is on phentermine for weight loss which I have held.    Active Problems:   Migraines Report being on Topamax  occasionally at home. Asymptomatic at this time    Mild dysplasia of cervix    Polycystic ovarian syndrome     Diet: Clear Liquid  DVT prophylaxis: sq lovenox   Code Status: full code Family Communication: discussed with patient and her brother at bedside Disposition Plan: Admit under observation.  discharge home possibly tomorrow if symptoms improve  Louellen Molder [facility] Pager [phone]  Total time spent on admission :50 minutes  If 7PM-7AM, please contact night-coverage [email or web address] Password [email or web address] 10/05/2015, 3:06 PM

## 2015-10-06 DIAGNOSIS — K529 Noninfective gastroenteritis and colitis, unspecified: Secondary | ICD-10-CM | POA: Diagnosis not present

## 2015-10-06 DIAGNOSIS — E282 Polycystic ovarian syndrome: Secondary | ICD-10-CM | POA: Diagnosis not present

## 2015-10-06 DIAGNOSIS — E669 Obesity, unspecified: Secondary | ICD-10-CM | POA: Diagnosis present

## 2015-10-06 DIAGNOSIS — K589 Irritable bowel syndrome without diarrhea: Secondary | ICD-10-CM

## 2015-10-06 LAB — BASIC METABOLIC PANEL
ANION GAP: 6 (ref 5–15)
BUN: <5 mg/dL — ABNORMAL LOW (ref 6–20)
CALCIUM: 8.1 mg/dL — AB (ref 8.9–10.3)
CO2: 25 mmol/L (ref 22–32)
CREATININE: 0.73 mg/dL (ref 0.44–1.00)
Chloride: 103 mmol/L (ref 101–111)
Glucose, Bld: 118 mg/dL — ABNORMAL HIGH (ref 65–99)
Potassium: 3.3 mmol/L — ABNORMAL LOW (ref 3.5–5.1)
SODIUM: 134 mmol/L — AB (ref 135–145)

## 2015-10-06 LAB — CBC
HEMATOCRIT: 35.1 % — AB (ref 36.0–46.0)
Hemoglobin: 11.4 g/dL — ABNORMAL LOW (ref 12.0–15.0)
MCH: 26.7 pg (ref 26.0–34.0)
MCHC: 32.5 g/dL (ref 30.0–36.0)
MCV: 82.2 fL (ref 78.0–100.0)
PLATELETS: 234 10*3/uL (ref 150–400)
RBC: 4.27 MIL/uL (ref 3.87–5.11)
RDW: 13.3 % (ref 11.5–15.5)
WBC: 7.5 10*3/uL (ref 4.0–10.5)

## 2015-10-06 MED ORDER — HYOSCYAMINE SULFATE ER 0.375 MG PO TB12
0.3750 mg | ORAL_TABLET | Freq: Two times a day (BID) | ORAL | Status: DC
  Administered 2015-10-06 – 2015-10-08 (×5): 0.375 mg via ORAL
  Filled 2015-10-06 (×7): qty 1

## 2015-10-06 MED ORDER — HYDROMORPHONE HCL 1 MG/ML IJ SOLN
0.5000 mg | INTRAMUSCULAR | Status: DC | PRN
  Administered 2015-10-06 – 2015-10-08 (×8): 0.5 mg via INTRAVENOUS
  Filled 2015-10-06 (×8): qty 1

## 2015-10-06 NOTE — Progress Notes (Signed)
PROGRESS NOTE  Erika Osborne [id number] DOB: [birth year] DOA: 10/05/2015 PCP: Lynne Logan, MD  HPI/Recap of past 52 hours: 29 year old female with past mental history of polycystic ovarian syndrome and irritable bowel syndrome, presented to emergency room on 12/16 with 2 days of diffuse abdominal pain. CT scan noted wall thickening multiple proximal and mid small bowel loops and ascending colon with mesenteric edema and a small ascites suggestive of enteritis/colitis. Patient started on clear liquids, IV fluids, medicine for pain and nausea and IV Cipro and Flagyl and brought in for observation.  Today, patient complaining of continued pain, mostly in left lower quadrant. States pain medication causes her to have spasm-like sensations. Tolerating clear liquids.  Assessment/Plan: Principal Problem:   Acute colitis: Suspect infectious process. Continue Cipro and Flagyl. Add Levbid for anti-spasmodic. Continue IV fluids and when necessary medicine for nausea. Hopefully should turn around within the next 24 hours Active Problems:   Polycystic ovarian syndrome: Stable   IBS (irritable bowel syndrome)   Obesity (BMI 30-39.9): Patient meets criteria with BMI greater than 30   Code Status: Full code   Family Communication: Patient declined for me to call her family   Disposition Plan: Potential discharge tomorrow    Consultants:  None   Procedures:  None   Antibiotics:     IV Cipro 12/16-present IV Flagyl 12/16-present  Objective: BP 123/78 mmHg  Pulse 89  Temp(Src) 99.6 F (37.6 C) (Oral)  Resp 19  Ht 5\' 2"  (1.575 m)  Wt 81.647 kg (180 lb)  BMI 32.91 kg/m2  SpO2 98%  LMP 10/05/2015  Intake/Output Summary (Last 24 hours) at 10/06/15 1606 Last data filed at 10/06/15 1338  Gross per 24 hour  Intake 3937.5 ml  Output    500 ml  Net 3437.5 ml   Filed Weights   10/05/15 0813  Weight: 81.647 kg (180 lb)    Exam:   General:  alert and oriented 3, mild  distress secondary to pain   Cardiovascular: regular rate and rhythm, S1 and S2   Respiratory: clear to auscultation bilaterally   Abdomen: soft, subjective mild tenderness mostly left lower quadrant, obese, hyperactive bowel sounds   Musculoskeletal: trace pitting edema bilaterally    Data Reviewed: Basic Metabolic Panel:  Recent Labs Lab 10/04/15 1008 10/05/15 0836 10/05/15 1913 10/06/15 0355  NA 136 136  --  134*  K 3.9 4.1  --  3.3*  CL 102 103  --  103  CO2 24 26  --  25  GLUCOSE 96 118*  --  118*  BUN <5* <5*  --  <5*  CREATININE 0.95 0.84 0.82 0.73  CALCIUM 9.4 8.9  --  8.1*   Liver Function Tests:  Recent Labs Lab 10/04/15 1008 10/05/15 0836  AST 20 18  ALT 16 14  ALKPHOS 46 41  BILITOT 0.8 1.0  PROT 7.7 6.9  ALBUMIN 3.5 3.2*    Recent Labs Lab 10/04/15 1008 10/05/15 0836  LIPASE 28 32   No results for input(s): AMMONIA in the last 168 hours. CBC:  Recent Labs Lab 10/04/15 1008 10/05/15 0836 10/05/15 1913 10/06/15 0355  WBC 7.8 10.0 8.4 7.5  NEUTROABS 5.8 7.6  --   --   HGB 13.0 13.0 12.0 11.4*  HCT 41.3 40.6 37.3 35.1*  MCV 82.3 81.9 82.3 82.2  PLT 241 248 229 234   Cardiac Enzymes:   No results for input(s): CKTOTAL, CKMB, CKMBINDEX, TROPONINI in the last 168 hours. BNP (last 3 results) No  results for input(s): BNP in the last 8760 hours.  ProBNP (last 3 results) No results for input(s): PROBNP in the last 8760 hours.  CBG: No results for input(s): GLUCAP in the last 168 hours.  No results found for this or any previous visit (from the past 240 hour(s)).   Studies: No results found.  Scheduled Meds: . ciprofloxacin  400 mg Intravenous Q12H  . enoxaparin (LOVENOX) injection  40 mg Subcutaneous Q24H  . hyoscyamine  0.375 mg Oral Q12H  . Influenza vac split quadrivalent PF  0.5 mL Intramuscular Tomorrow-1000  . metronidazole  500 mg Intravenous Once  . metronidazole  500 mg Intravenous Q8H    Continuous Infusions: .  sodium chloride 75 mL/hr at 10/06/15 1244     Time spent: 15 minutes   [facility] Pager [phone] . If 7PM-7AM, please contact night-coverage at [email or web address], password [facility] 10/06/2015, 4:06 PM

## 2015-10-06 NOTE — Progress Notes (Signed)
Pt crying and in excruciating pain L abd, applied heat, pain score 10+.  Gave MSO4 2mg  as ordered.  Pt states that when she gets the pain med, initially it causes some cramping in her stomach and then it helps some.  Stayed with pt.  Took 10-15 minues for the IV pain med to start working.  Pain score now a 7.

## 2015-10-07 DIAGNOSIS — Z8659 Personal history of other mental and behavioral disorders: Secondary | ICD-10-CM | POA: Diagnosis not present

## 2015-10-07 DIAGNOSIS — E282 Polycystic ovarian syndrome: Secondary | ICD-10-CM | POA: Diagnosis present

## 2015-10-07 DIAGNOSIS — Z79899 Other long term (current) drug therapy: Secondary | ICD-10-CM | POA: Diagnosis not present

## 2015-10-07 DIAGNOSIS — Z6832 Body mass index (BMI) 32.0-32.9, adult: Secondary | ICD-10-CM | POA: Diagnosis not present

## 2015-10-07 DIAGNOSIS — Z8742 Personal history of other diseases of the female genital tract: Secondary | ICD-10-CM | POA: Diagnosis not present

## 2015-10-07 DIAGNOSIS — G43909 Migraine, unspecified, not intractable, without status migrainosus: Secondary | ICD-10-CM | POA: Diagnosis present

## 2015-10-07 DIAGNOSIS — K589 Irritable bowel syndrome without diarrhea: Secondary | ICD-10-CM | POA: Diagnosis present

## 2015-10-07 DIAGNOSIS — E669 Obesity, unspecified: Secondary | ICD-10-CM | POA: Diagnosis present

## 2015-10-07 DIAGNOSIS — Z8619 Personal history of other infectious and parasitic diseases: Secondary | ICD-10-CM | POA: Diagnosis not present

## 2015-10-07 DIAGNOSIS — Z8679 Personal history of other diseases of the circulatory system: Secondary | ICD-10-CM | POA: Diagnosis not present

## 2015-10-07 DIAGNOSIS — Z793 Long term (current) use of hormonal contraceptives: Secondary | ICD-10-CM | POA: Diagnosis not present

## 2015-10-07 DIAGNOSIS — Z87891 Personal history of nicotine dependence: Secondary | ICD-10-CM | POA: Diagnosis not present

## 2015-10-07 DIAGNOSIS — K529 Noninfective gastroenteritis and colitis, unspecified: Secondary | ICD-10-CM | POA: Diagnosis present

## 2015-10-07 DIAGNOSIS — N879 Dysplasia of cervix uteri, unspecified: Secondary | ICD-10-CM | POA: Diagnosis present

## 2015-10-07 DIAGNOSIS — R112 Nausea with vomiting, unspecified: Secondary | ICD-10-CM | POA: Diagnosis present

## 2015-10-07 LAB — BASIC METABOLIC PANEL WITH GFR
Anion gap: 8 (ref 5–15)
BUN: <5 mg/dL — ABNORMAL LOW (ref 6–20)
CO2: 25 mmol/L (ref 22–32)
Calcium: 8.2 mg/dL — ABNORMAL LOW (ref 8.9–10.3)
Chloride: 102 mmol/L (ref 101–111)
Creatinine, Ser: 0.68 mg/dL (ref 0.44–1.00)
GFR calc Af Amer: >60 mL/min
GFR calc non Af Amer: >60 mL/min
Glucose, Bld: 89 mg/dL (ref 65–99)
Potassium: 3.5 mmol/L (ref 3.5–5.1)
Sodium: 135 mmol/L (ref 135–145)

## 2015-10-07 NOTE — Progress Notes (Signed)
PROGRESS NOTE  Erika Osborne [id number] DOB: 07-Sep-1986 DOA: 10/05/2015 PCP: Lynne Logan, MD  HPI/Recap of past 24 hours: 29 year old female with past mental history of polycystic ovarian syndrome and irritable bowel syndrome, presented to emergency room on 12/16 with 2 days of diffuse abdominal pain. CT scan noted wall thickening multiple proximal and mid small bowel loops and ascending colon with mesenteric edema and a small ascites suggestive of enteritis/colitis. Patient started on clear liquids, IV fluids, medicine for pain and nausea and IV Cipro and Flagyl and brought in for observation.   patient continues to improve. Was having spasm-like episodes in her abdomen after getting pain medication, but  Less so today. Tolerating clears and would like to advance diet. Felt better after bowel movement.  Assessment/Plan: Principal Problem:   Acute colitis: Suspect infectious process. Continue Cipro and Flagyl. Added Levbid for anti-spasmodic. Continue IV fluids and when necessary medicine for nausea.  Advancing diet  Active Problems:   Polycystic ovarian syndrome: Stable   IBS (irritable bowel syndrome)   Obesity (BMI 30-39.9): Patient meets criteria with BMI greater than 30   Code Status: Full code   Family Communication: Patient declined for me to call her family   Disposition Plan:  Anticipated discharge tomorrow   Consultants:  None   Procedures:  None   Antibiotics:     IV Cipro 12/16-present IV Flagyl 12/16-present  Objective: BP 113/77 mmHg  Pulse 92  Temp(Src) 99.1 F (37.3 C) (Oral)  Resp 19  Ht 5\' 2"  (1.575 m)  Wt 81.647 kg (180 lb)  BMI 32.91 kg/m2  SpO2 99%  LMP 10/05/2015  Intake/Output Summary (Last 24 hours) at 10/07/15 1305 Last data filed at 10/07/15 [id number]  Gross per 24 hour  Intake 5384.17 ml  Output   2126 ml  Net 3258.17 ml   Filed Weights   10/05/15 0813  Weight: 81.647 kg (180 lb)    Exam:   General:  alert and oriented  3,  No acute distress  Cardiovascular: regular rate and rhythm, S1 and S2   Respiratory: clear to auscultation bilaterally   Abdomen: soft,  Decreased minimal tenderness throughout, obese, hyperactive bowel sounds   Musculoskeletal: trace pitting edema bilaterally    Data Reviewed: Basic Metabolic Panel:  Recent Labs Lab 10/04/15 1008 10/05/15 0836 10/05/15 1913 10/06/15 0355 10/07/15 0439  NA 136 136  --  134* 135  K 3.9 4.1  --  3.3* 3.5  CL 102 103  --  103 102  CO2 24 26  --  25 25  GLUCOSE 96 118*  --  118* 89  BUN <5* <5*  --  <5* <5*  CREATININE 0.95 0.84 0.82 0.73 0.68  CALCIUM 9.4 8.9  --  8.1* 8.2*   Liver Function Tests:  Recent Labs Lab 10/04/15 1008 10/05/15 0836  AST 20 18  ALT 16 14  ALKPHOS 46 41  BILITOT 0.8 1.0  PROT 7.7 6.9  ALBUMIN 3.5 3.2*    Recent Labs Lab 10/04/15 1008 10/05/15 0836  LIPASE 28 32   No results for input(s): AMMONIA in the last 168 hours. CBC:  Recent Labs Lab 10/04/15 1008 10/05/15 0836 10/05/15 1913 10/06/15 0355  WBC 7.8 10.0 8.4 7.5  NEUTROABS 5.8 7.6  --   --   HGB 13.0 13.0 12.0 11.4*  HCT 41.3 40.6 37.3 35.1*  MCV 82.3 81.9 82.3 82.2  PLT 241 248 229 234   Cardiac Enzymes:   No results for input(s): CKTOTAL, CKMB, CKMBINDEX,  TROPONINI in the last 168 hours. BNP (last 3 results) No results for input(s): BNP in the last 8760 hours.  ProBNP (last 3 results) No results for input(s): PROBNP in the last 8760 hours.  CBG: No results for input(s): GLUCAP in the last 168 hours.  No results found for this or any previous visit (from the past 240 hour(s)).   Studies: No results found.  Scheduled Meds: . ciprofloxacin  400 mg Intravenous Q12H  . enoxaparin (LOVENOX) injection  40 mg Subcutaneous Q24H  . hyoscyamine  0.375 mg Oral Q12H  . Influenza vac split quadrivalent PF  0.5 mL Intramuscular Tomorrow-1000  . metronidazole  500 mg Intravenous Once  . metronidazole  500 mg Intravenous Q8H     Continuous Infusions: . sodium chloride 10 mL/hr at 10/07/15 1108     Time spent: 15 minutes   [facility] Pager [phone] . If 7PM-7AM, please contact night-coverage at [email or web address], password [facility] 10/07/2015, 1:05 PM

## 2015-10-07 NOTE — Progress Notes (Signed)
Pt reports had a BM this am, feels much better than yesterday.

## 2015-10-08 ENCOUNTER — Emergency Department (HOSPITAL_COMMUNITY)
Admission: EM | Admit: 2015-10-08 | Discharge: 2015-10-09 | Disposition: A | Discharge status: Home / Self Care | Payer: BC Managed Care – PPO | Attending: Emergency Medicine | Admitting: Emergency Medicine

## 2015-10-08 ENCOUNTER — Encounter (HOSPITAL_COMMUNITY): Payer: Self-pay | Admitting: Emergency Medicine

## 2015-10-08 DIAGNOSIS — K529 Noninfective gastroenteritis and colitis, unspecified: Secondary | ICD-10-CM | POA: Diagnosis not present

## 2015-10-08 DIAGNOSIS — Z8659 Personal history of other mental and behavioral disorders: Secondary | ICD-10-CM | POA: Insufficient documentation

## 2015-10-08 DIAGNOSIS — Z79899 Other long term (current) drug therapy: Secondary | ICD-10-CM | POA: Insufficient documentation

## 2015-10-08 DIAGNOSIS — Z8742 Personal history of other diseases of the female genital tract: Secondary | ICD-10-CM | POA: Insufficient documentation

## 2015-10-08 DIAGNOSIS — Z8619 Personal history of other infectious and parasitic diseases: Secondary | ICD-10-CM | POA: Insufficient documentation

## 2015-10-08 DIAGNOSIS — Z87891 Personal history of nicotine dependence: Secondary | ICD-10-CM | POA: Insufficient documentation

## 2015-10-08 DIAGNOSIS — Z8679 Personal history of other diseases of the circulatory system: Secondary | ICD-10-CM | POA: Insufficient documentation

## 2015-10-08 HISTORY — DX: Noninfective gastroenteritis and colitis, unspecified: K52.9

## 2015-10-08 MED ORDER — FLUCONAZOLE 150 MG PO TABS: 150.0000 mg | ORAL_TABLET | Freq: Once | ORAL | Status: DC

## 2015-10-08 MED ORDER — CIPROFLOXACIN HCL 500 MG PO TABS: 500.0000 mg | ORAL_TABLET | Freq: Two times a day (BID) | ORAL | Status: AC

## 2015-10-08 MED ORDER — PROMETHAZINE HCL 12.5 MG PO TABS: 12.5000 mg | ORAL_TABLET | Freq: Four times a day (QID) | ORAL | Status: DC | PRN

## 2015-10-08 MED ORDER — METRONIDAZOLE 500 MG PO TABS: 500.0000 mg | ORAL_TABLET | Freq: Three times a day (TID) | ORAL | Status: AC

## 2015-10-08 NOTE — ED Notes (Signed)
Pt. reports LLQ pain with nausea and emesis onset this evening unrelieved by prescription antiemetic  , she was just discharged this afternoon admitted last 12/16 for colitis .

## 2015-10-08 NOTE — Discharge Instructions (Signed)
°  Colitis °Colitis is inflammation of the colon. Colitis may last a short time (acute) or it may last a long time (chronic). °CAUSES °This condition may be caused by: °· Viruses. °· Bacteria. °· Reactions to medicine. °· Certain autoimmune diseases, such as Crohn disease or ulcerative colitis. °SYMPTOMS °Symptoms of this condition include: °· Diarrhea. °· Passing bloody or tarry stool. °· Pain. °· Fever. °· Vomiting. °· Tiredness (fatigue). °· Weight loss. °· Bloating. °· Sudden increase in abdominal pain. °· Having fewer bowel movements than usual. °DIAGNOSIS °This condition is diagnosed with a stool test or a blood test. You may also have other tests, including X-rays, a CT scan, or a colonoscopy. °TREATMENT °Treatment may include: °· Resting the bowel. This involves not eating or drinking for a period of time. °· Fluids that are given through an IV tube. °· Medicine for pain and diarrhea. °· Antibiotic medicines. °· Cortisone medicines. °· Surgery. °HOME CARE INSTRUCTIONS °Eating and Drinking °· Follow instructions from your health care provider about eating or drinking restrictions. °· Drink enough fluid to keep your urine clear or pale yellow. °· Work with a dietitian to determine which foods cause your condition to flare up. °· Avoid foods that cause flare-ups. °· Eat a well-balanced diet. °Medicines °· Take over-the-counter and prescription medicines only as told by your health care provider. °· If you were prescribed an antibiotic medicine, take it as told by your health care provider. Do not stop taking the antibiotic even if you start to feel better. °General Instructions °· Keep all follow-up visits as told by your health care provider. This is important. °SEEK MEDICAL CARE IF: °· Your symptoms do not go away. °· You develop new symptoms. °SEEK IMMEDIATE MEDICAL CARE IF: °· You have a fever that does not go away with treatment. °· You develop chills. °· You have extreme weakness, fainting, or  dehydration. °· You have repeated vomiting. °· You develop severe pain in your abdomen. °· You pass bloody or tarry stool. °  °This information is not intended to replace advice given to you by your health care provider. Make sure you discuss any questions you have with your health care provider. °  °Document Released: 11/13/2004 Document Revised: 06/27/2015 Document Reviewed: 01/29/2015 °Elsevier Interactive Patient Education ©2016 [employer] ° °

## 2015-10-08 NOTE — Discharge Summary (Signed)
Discharge Summary  Erika Osborne [id number] DOB: 1986/08/13  PCP: Lynne Logan, MD  Admit date: 10/05/2015 Discharge date: 10/08/2015  Time spent: 25 minutes   Recommendations for Outpatient Follow-up:  1. New medication: Cipro 500 mg by mouth twice a day and Flagyl 500 mg 3 times a day 3 more days 2. New medication: Phenergan 12.5 mg every 6 hours as needed for nausea 3. New medication: Diflucan 150 mg by mouth once, use if yeast infection develops   Discharge Diagnoses:  Active Hospital Problems   Diagnosis Date Noted  . Acute colitis 10/05/2015  . Obesity (BMI 30-39.9) 10/06/2015  . IBS (irritable bowel syndrome) 01/09/2014  . Polycystic ovarian syndrome 07/22/2012    Resolved Hospital Problems   Diagnosis Date Noted Date Resolved  No resolved problems to display.    Discharge Condition: Improved, being discharged home, advised to rest for the next few days before returning to work   Diet recommendation: None   Filed Weights   10/05/15 0813  Weight: 81.647 kg (180 lb)    History of present illness:  29 year old female with past mental history of polycystic ovarian syndrome and irritable bowel syndrome, presented to emergency room on 12/16 with 2 days of diffuse abdominal pain. CT scan noted wall thickening multiple proximal and mid small bowel loops and ascending colon with mesenteric edema and a small ascites suggestive of enteritis/colitis. Patient started on clear liquids, IV fluids, medicine for pain and nausea and IV Cipro and Flagyl and brought in for observation.  Hospital Course:  Principal Problem:   Acute colitis: Patient did well over the next few days. Less and less pain. Diet slowly advanced from clears to soft bland diet. by 12/19, she was tolerating solid food. She was continued on antibiotics. Plan will be to discharge home with several more days of antibiotics with Diflucan given in case to yeast infection develops Active Problems:   Polycystic  ovarian syndrome: Stable   IBS (irritable bowel syndrome): Stable   Obesity (BMI 30-39.9): Patient meets criteria with BMI greater than 30   Procedures:  None   Consultations:  None   Discharge Exam: BP 101/66 mmHg  Pulse 79  Temp(Src) 97.8 F (36.6 C) (Oral)  Resp 16  Ht 5\' 2"  (1.575 m)  Wt 81.647 kg (180 lb)  BMI 32.91 kg/m2  SpO2 100%  LMP 10/05/2015  General: Alert and oriented 3, no acute distress  Cardiovascular: Regular rate and rhythm, S1-S2  Respiratory: Clear to auscultation bilaterally   Discharge Instructions You were cared for by a hospitalist during your hospital stay. If you have any questions about your discharge medications or the care you received while you were in the hospital after you are discharged, you can call the unit and asked to speak with the hospitalist on call if the hospitalist that took care of you is not available. Once you are discharged, your primary care physician will handle any further medical issues. Please note that NO REFILLS for any discharge medications will be authorized once you are discharged, as it is imperative that you return to your primary care physician (or establish a relationship with a primary care physician if you do not have one) for your aftercare needs so that they can reassess your need for medications and monitor your lab values.  Discharge Instructions    Diet general    Complete by:  As directed      Increase activity slowly    Complete by:  As directed  Medication List    STOP taking these medications        HYDROcodone-acetaminophen 5-325 MG tablet  Commonly known as:  NORCO/VICODIN     medroxyPROGESTERone 10 MG tablet  Commonly known as:  PROVERA     traMADol 50 MG tablet  Commonly known as:  ULTRAM      TAKE these medications        albuterol 108 (90 BASE) MCG/ACT inhaler  Commonly known as:  PROVENTIL HFA;VENTOLIN HFA  Inhale 2 puffs into the lungs every 6 (six) hours as needed.  For wheezing     ciprofloxacin 500 MG tablet  Commonly known as:  CIPRO  Take 1 tablet (500 mg total) by mouth 2 (two) times daily.     fluconazole 150 MG tablet  Commonly known as:  DIFLUCAN  Take 1 tablet (150 mg total) by mouth once. Take if yeast infection occurs     metroNIDAZOLE 500 MG tablet  Commonly known as:  FLAGYL  Take 1 tablet (500 mg total) by mouth 3 (three) times daily.     omeprazole 20 MG capsule  Commonly known as:  PRILOSEC  Take 1 capsule (20 mg total) by mouth 2 (two) times daily.     ondansetron 4 MG disintegrating tablet  Commonly known as:  ZOFRAN ODT  Take 1 tablet (4 mg total) by mouth every 8 (eight) hours as needed for nausea.     phentermine 37.5 MG tablet  Commonly known as:  ADIPEX-P  Take 37.5 mg by mouth daily before breakfast.     promethazine 12.5 MG tablet  Commonly known as:  PHENERGAN  Take 1 tablet (12.5 mg total) by mouth every 6 (six) hours as needed for nausea or vomiting.       No Known Allergies    The results of significant diagnostics from this hospitalization (including imaging, microbiology, ancillary and laboratory) are listed below for reference.    Significant Diagnostic Studies: US Abdomen Complete  10/05/2015  CLINICAL DATA:  Epigastric pain. EXAM: ULTRASOUND ABDOMEN COMPLETE COMPARISON:  None. FINDINGS: Gallbladder: No gallstones or wall thickening visualized. No sonographic Murphy sign noted. Common bile duct: Diameter: 4.9 mm Liver: No focal lesion identified. Within normal limits in parenchymal echogenicity. IVC: No abnormality visualized. Pancreas: Visualized portion unremarkable. Spleen: Size and appearance within normal limits. Right Kidney: Length: 9.8 cm. Echogenicity within normal limits. No mass or hydronephrosis visualized. Left Kidney: Length: 9.5 cm. Echogenicity within normal limits. No mass or hydronephrosis visualized. Abdominal aorta: No aneurysm visualized. Other findings: None. IMPRESSION: Normal  abdominal ultrasound. Electronically Signed   By: Kathreen Devoid   On: 10/05/2015 10:44   Ct Abdomen Pelvis W Contrast  10/05/2015  CLINICAL DATA:  29 year old female with left-sided abdominal and pelvic pain for 1 day. EXAM: CT ABDOMEN AND PELVIS WITH CONTRAST TECHNIQUE: Multidetector CT imaging of the abdomen and pelvis was performed using the standard protocol following bolus administration of intravenous contrast. CONTRAST:  143mL OMNIPAQUE IOHEXOL 300 MG/ML  SOLN COMPARISON:  04/01/2009 abdominal ultrasound. FINDINGS: Lower chest:  Unremarkable Hepatobiliary: The liver and gallbladder are unremarkable. There is no evidence of biliary dilatation. Pancreas: Unremarkable Spleen: Unremarkable Adrenals/Urinary Tract: The kidneys, adrenal glands and bladder are unremarkable. Stomach/Bowel: There is mild wall thickening of multiple proximal and mid small bowel loops and equivocal wall thickening of the descending colon. There is no evidence of bowel obstruction, pneumoperitoneum or abscess. A small amount of ascites within the abdomen and pelvis noted is well is mesenteric edema. Gas  in the appendix is noted. Vascular/Lymphatic: No enlarged lymph nodes or abdominal aortic aneurysm. The visualized mesenteric arteries and veins are patent. Reproductive: The uterus and adnexal regions are unremarkable except for a 2.8 cm right ovarian cyst/follicle. Other: None. Musculoskeletal: No acute or suspicious abnormality. IMPRESSION: Wall thickening of multiple proximal and mid small bowel loops and the descending colon with mesenteric edema and small amount of ascites, suggesting enteritis and colitis. No evidence of bowel obstruction, pneumoperitoneum or focal abscess. Electronically Signed   By: Margarette Canada M.D.   On: 10/05/2015 14:02    Microbiology: No results found for this or any previous visit (from the past 240 hour(s)).   Labs: Basic Metabolic Panel:  Recent Labs Lab 10/04/15 1008 10/05/15 0836  10/05/15 1913 10/06/15 0355 10/07/15 0439  NA 136 136  --  134* 135  K 3.9 4.1  --  3.3* 3.5  CL 102 103  --  103 102  CO2 24 26  --  25 25  GLUCOSE 96 118*  --  118* 89  BUN <5* <5*  --  <5* <5*  CREATININE 0.95 0.84 0.82 0.73 0.68  CALCIUM 9.4 8.9  --  8.1* 8.2*   Liver Function Tests:  Recent Labs Lab 10/04/15 1008 10/05/15 0836  AST 20 18  ALT 16 14  ALKPHOS 46 41  BILITOT 0.8 1.0  PROT 7.7 6.9  ALBUMIN 3.5 3.2*    Recent Labs Lab 10/04/15 1008 10/05/15 0836  LIPASE 28 32   No results for input(s): AMMONIA in the last 168 hours. CBC:  Recent Labs Lab 10/04/15 1008 10/05/15 0836 10/05/15 1913 10/06/15 0355  WBC 7.8 10.0 8.4 7.5  NEUTROABS 5.8 7.6  --   --   HGB 13.0 13.0 12.0 11.4*  HCT 41.3 40.6 37.3 35.1*  MCV 82.3 81.9 82.3 82.2  PLT 241 248 229 234   Cardiac Enzymes: No results for input(s): CKTOTAL, CKMB, CKMBINDEX, TROPONINI in the last 168 hours. BNP: BNP (last 3 results) No results for input(s): BNP in the last 8760 hours.  ProBNP (last 3 results) No results for input(s): PROBNP in the last 8760 hours.  CBG: No results for input(s): GLUCAP in the last 168 hours.     Signed:  Annita Brod  [facility] 10/08/2015, 11:32 AM

## 2015-10-08 NOTE — Progress Notes (Signed)
Erika Osborne to be D/C'd home per MD order. Discussed with the patient and all questions fully answered.  VSS, Skin clean, dry and intact without evidence of skin break down, no evidence of skin tears noted.  IV catheter discontinued intact. Site without signs and symptoms of complications. Dressing and pressure applied.  An After Visit Summary was printed and given to the patient. Patient received prescriptions.  D/c education completed with patient/family including follow up instructions, medication list, d/c activities limitations if indicated, with other d/c instructions as indicated by MD - patient able to verbalize understanding, all questions fully answered.   Patient instructed to return to ED, call 911, or call MD for any changes in condition.   Patient to be escorted via [place], and D/C home via private auto.

## 2015-10-09 LAB — COMPREHENSIVE METABOLIC PANEL
ALT: 38 U/L (ref 14–54)
ANION GAP: 8 (ref 5–15)
AST: 55 U/L — AB (ref 15–41)
Albumin: 2.9 g/dL — ABNORMAL LOW (ref 3.5–5.0)
Alkaline Phosphatase: 40 U/L (ref 38–126)
BILIRUBIN TOTAL: 0.3 mg/dL (ref 0.3–1.2)
BUN: <5 mg/dL — ABNORMAL LOW (ref 6–20)
CHLORIDE: 106 mmol/L (ref 101–111)
CO2: 28 mmol/L (ref 22–32)
Calcium: 9.1 mg/dL (ref 8.9–10.3)
Creatinine, Ser: 0.81 mg/dL (ref 0.44–1.00)
Glucose, Bld: 116 mg/dL — ABNORMAL HIGH (ref 65–99)
POTASSIUM: 4.3 mmol/L (ref 3.5–5.1)
Sodium: 142 mmol/L (ref 135–145)
TOTAL PROTEIN: 6.6 g/dL (ref 6.5–8.1)

## 2015-10-09 LAB — CBC
HEMATOCRIT: 36.2 % (ref 36.0–46.0)
Hemoglobin: 11.5 g/dL — ABNORMAL LOW (ref 12.0–15.0)
MCH: 25.9 pg — ABNORMAL LOW (ref 26.0–34.0)
MCHC: 31.8 g/dL (ref 30.0–36.0)
MCV: 81.5 fL (ref 78.0–100.0)
PLATELETS: 385 10*3/uL (ref 150–400)
RBC: 4.44 MIL/uL (ref 3.87–5.11)
RDW: 12.9 % (ref 11.5–15.5)
WBC: 4.9 10*3/uL (ref 4.0–10.5)

## 2015-10-09 LAB — URINE MICROSCOPIC-ADD ON

## 2015-10-09 LAB — URINALYSIS, ROUTINE W REFLEX MICROSCOPIC
Bilirubin Urine: NEGATIVE
Glucose, UA: NEGATIVE mg/dL
Ketones, ur: 15 mg/dL — AB
NITRITE: NEGATIVE
PROTEIN: NEGATIVE mg/dL
SPECIFIC GRAVITY, URINE: 1.02 (ref 1.005–1.030)
pH: 6 (ref 5.0–8.0)

## 2015-10-09 LAB — POC URINE PREG, ED: PREG TEST UR: NEGATIVE

## 2015-10-09 LAB — LIPASE, BLOOD: LIPASE: 41 U/L (ref 11–51)

## 2015-10-09 MED ORDER — HYDROMORPHONE HCL 1 MG/ML IJ SOLN
1.0000 mg | Freq: Once | INTRAMUSCULAR | Status: AC
  Administered 2015-10-09: 1 mg via INTRAVENOUS
  Filled 2015-10-09: qty 1

## 2015-10-09 MED ORDER — HYDROCODONE-ACETAMINOPHEN 5-325 MG PO TABS: 1.0000 | ORAL_TABLET | ORAL | Status: DC | PRN

## 2015-10-09 MED ORDER — ONDANSETRON 4 MG PO TBDP: 4.0000 mg | ORAL_TABLET | Freq: Three times a day (TID) | ORAL | Status: DC | PRN

## 2015-10-09 MED ORDER — PROMETHAZINE HCL 25 MG/ML IJ SOLN
25.0000 mg | Freq: Once | INTRAMUSCULAR | Status: AC
  Administered 2015-10-09: 25 mg via INTRAVENOUS
  Filled 2015-10-09: qty 1

## 2015-10-09 MED ORDER — SODIUM CHLORIDE 0.9 % IV BOLUS (SEPSIS)
1000.0000 mL | Freq: Once | INTRAVENOUS | Status: AC
  Administered 2015-10-09: 1000 mL via INTRAVENOUS

## 2015-10-09 MED ORDER — HYDROCODONE-ACETAMINOPHEN 5-325 MG PO TABS
1.0000 | ORAL_TABLET | Freq: Once | ORAL | Status: AC
  Administered 2015-10-09: 1 via ORAL
  Filled 2015-10-09: qty 1

## 2015-10-09 MED ORDER — PROMETHAZINE HCL 12.5 MG PO TABS: 12.5000 mg | ORAL_TABLET | Freq: Four times a day (QID) | ORAL | Status: DC | PRN

## 2015-10-09 NOTE — ED Provider Notes (Signed)
CSN: [id number]     Arrival date & time 10/08/15  2322 History  By signing my name below, I, Erika Osborne, attest that this documentation has been prepared under the direction and in the presence of Erika Pew, MD . Electronically Signed: Evelene Osborne, Scribe. 10/09/2015. 2:17 AM.    Chief Complaint  Patient presents with  . Abdominal Pain  . Emesis     The history is provided by the patient. No language interpreter was used.    HPI Comments:  Erika Osborne is a 29 y.o. female who presents to the Emergency Department complaining of cramping abdominal pain with intermittent sharp pain which returned ~ 2130 last night. She reports associated nausea and vomiting. Pt  was evaluated in the ED for same on 10/05/15 and was diagnosed with colitis and admitted . She was discharged yesterday (10/08/15) afternoon; notes her pain had resolved and nausea/vomiting improved. She was discharged with Zofran and antibiotic. Pt states she ate a grilled cheese sandwich yesterday afternoon after discharge and took her antibiotic before taking a nap. She slept for ~ 4 hours and when she woke up her nausea had returned and ~2130 she began vomiting. She notes her pain today is similar to episode prior to admission. No alleviating factors noted. She denies fever, chills and cough.   Past Medical History  Diagnosis Date  . 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 Menometrorrhagia 12/14/2008  . STD (sexually transmitted disease) 2010    treatment for chlamydia  . Endometrial polyp   . History of stomach ulcers 2010  . Migraines     "I take RX daily; still have one once/wk" (10/05/2015)  . Anxiety   . Colitis    Past Surgical History  Procedure Laterality Date  . Colposcopy    . Combined hysteroscopy diagnostic / d&c  2013  . Dilatation & curettage/hysteroscopy with trueclear N/A 06/09/2013    Procedure: DILATATION & CURETTAGE/HYSTEROSCOPY WITH TRUECLEAR;  Surgeon: Terrance Mass,  MD;  Location: [facility];  Service: Gynecology;  Laterality: N/A;  INSERVICE BY Jakie Debow   Family History  Problem Relation Age of Onset  . Diabetes Father   . Diabetes Paternal Grandfather    Social History  Substance Use Topics  . Smoking status: Former Smoker -- 3 years    Types: Cigars    Quit date: 12/15/2011  . Smokeless tobacco: Never Used  . Alcohol Use: Yes   OB History    Gravida Para Term Preterm AB TAB SAB Ectopic Multiple Living   0              Review of Systems  Constitutional: Negative for fever and chills.  Respiratory: Negative for cough.   Gastrointestinal: Positive for nausea, vomiting and abdominal pain.  All other systems reviewed and are negative.  Allergies  Review of patient's allergies indicates no known allergies.  Home Medications   Prior to Admission medications   Medication Sig Start Date End Date Taking? Authorizing Provider  albuterol (PROVENTIL HFA;VENTOLIN HFA) 108 (90 BASE) MCG/ACT inhaler Inhale 2 puffs into the lungs every 6 (six) hours as needed. For wheezing   Yes Historical Provider, MD  ciprofloxacin (CIPRO) 500 MG tablet Take 1 tablet (500 mg total) by mouth 2 (two) times daily. 10/08/15 10/11/15 Yes Annita Brod, MD  fluconazole (DIFLUCAN) 150 MG tablet Take 1 tablet (150 mg total) by mouth once. Take if yeast infection occurs 10/08/15  Yes Annita Brod, MD  metroNIDAZOLE (FLAGYL) 500 MG  tablet Take 1 tablet (500 mg total) by mouth 3 (three) times daily. 10/08/15 10/11/15 Yes Annita Brod, MD  omeprazole (PRILOSEC) 20 MG capsule Take 1 capsule (20 mg total) by mouth 2 (two) times daily. 10/04/15  Yes Tanna Furry, MD  phentermine (ADIPEX-P) 37.5 MG tablet Take 37.5 mg by mouth daily before breakfast.   Yes Historical Provider, MD  HYDROcodone-acetaminophen (NORCO/VICODIN) 5-325 MG tablet Take 1-2 tablets by mouth every 4 (four) hours as needed for severe pain. 10/09/15   Erika Pew, MD  ondansetron (ZOFRAN ODT) 4 MG  disintegrating tablet Take 1 tablet (4 mg total) by mouth every 8 (eight) hours as needed for nausea. 10/09/15   Erika Pew, MD  promethazine (PHENERGAN) 12.5 MG tablet Take 1 tablet (12.5 mg total) by mouth every 6 (six) hours as needed for nausea or vomiting. 10/09/15   Erika Pew, MD   BP 130/79 mmHg  Pulse 75  Temp(Src) 98.4 F (36.9 C) (Oral)  Resp 15  Ht 5\' 2"  (1.575 m)  Wt 180 lb (81.647 kg)  BMI 32.91 kg/m2  SpO2 96%  LMP 10/05/2015 Physical Exam  Constitutional: She is oriented to person, place, and time. She appears well-developed and well-nourished. No distress.  HENT:  Head: Normocephalic and atraumatic.  Eyes: Conjunctivae are normal.  Cardiovascular: Normal rate, regular rhythm and normal heart sounds.   Pulmonary/Chest: Effort normal and breath sounds normal. No respiratory distress.  Abdominal: Soft. She exhibits no distension. There is tenderness (LLQ). There is no rebound and no guarding.  Neurological: She is alert and oriented to person, place, and time.  Skin: Skin is warm and dry.  Psychiatric: She has a normal mood and affect.  Nursing note and vitals reviewed.   ED Course  Procedures  DIAGNOSTIC STUDIES:  Oxygen Saturation is 97% on RA, normal by my interpretation.    COORDINATION OF CARE:  2:07 AM Discussed treatment plan with pt at bedside and pt agreed to plan.  Labs Review Labs Reviewed  COMPREHENSIVE METABOLIC PANEL - Abnormal; Notable for the following:    Glucose, Bld 116 (*)    BUN <5 (*)    Albumin 2.9 (*)    AST 55 (*)    All other components within normal limits  CBC - Abnormal; Notable for the following:    Hemoglobin 11.5 (*)    MCH 25.9 (*)    All other components within normal limits  URINALYSIS, ROUTINE W REFLEX MICROSCOPIC (NOT AT [facility]) - Abnormal; Notable for the following:    APPearance CLOUDY (*)    Hgb urine dipstick LARGE (*)    Ketones, ur 15 (*)    Leukocytes, UA SMALL (*)    All other components within normal  limits  URINE MICROSCOPIC-ADD ON - Abnormal; Notable for the following:    Squamous Epithelial / LPF 0-5 (*)    Bacteria, UA RARE (*)    All other components within normal limits  LIPASE, BLOOD  POC URINE PREG, ED    Imaging Review No results found. I have personally reviewed and evaluated these lab results as part of my medical decision-making.   EKG Interpretation None      MDM   Final diagnoses:  Colitis     29 yo F w/ repeat pain and vomiting after discharge this AM for colitis. No fever, no leukocytosis or other concern for abscess or perforation. Observed in ED for >6 hours and symptoms controlled, repeat abdominal exams unremarkable. Will dc on pain meds with strict  return precautions.   I personally performed the services described in this documentation, which was scribed in my presence. The recorded information has been reviewed and is accurate.    Erika Pew, MD 10/09/15 [phone]

## 2016-09-03 IMAGING — US US ABDOMEN COMPLETE
1 series · 14 of 25 positions shown · non-contrast
Comparison: None.

CLINICAL DATA: Epigastric pain.

EXAM:
ULTRASOUND ABDOMEN COMPLETE

[Series 1: us abdomen complete · 0.22mm/px · 14 of 113 slices shown]
[im 1/113]
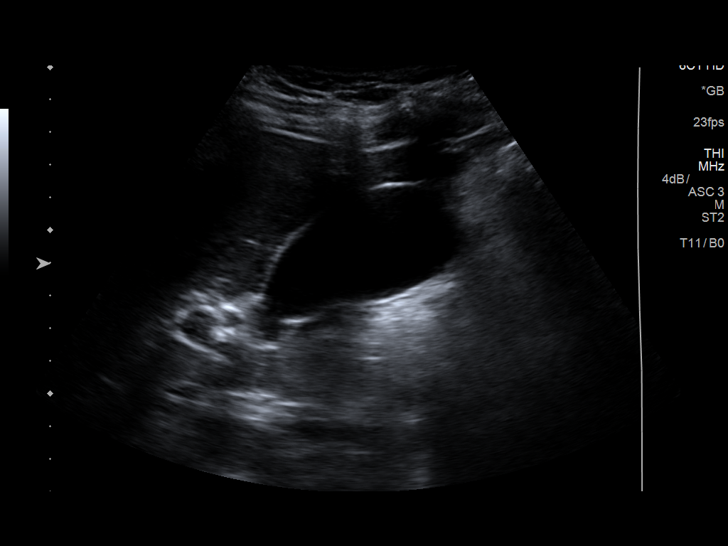
[im 10/113]
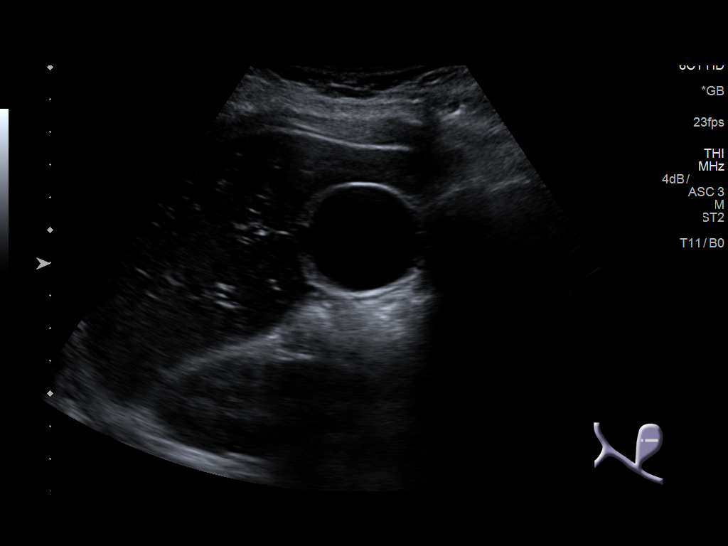
[im 19/113]
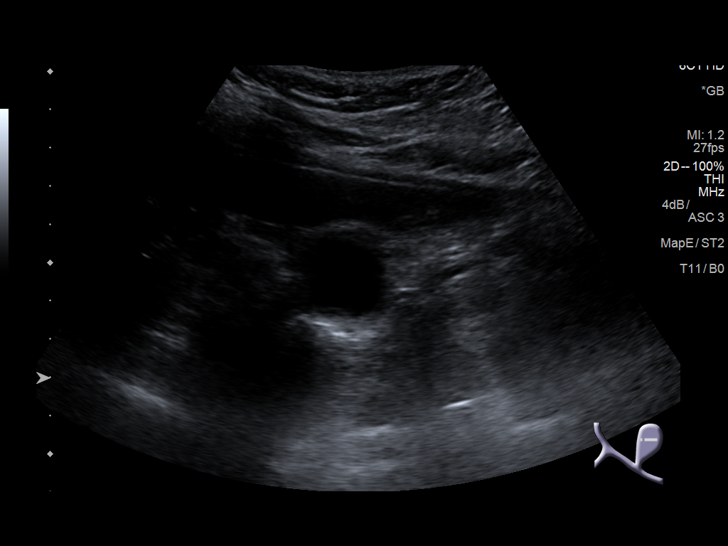
[im 29/113]
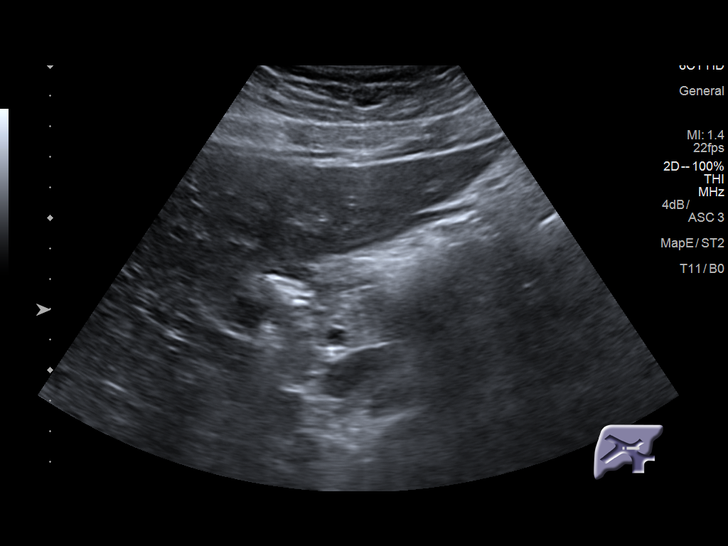
[im 38/113]
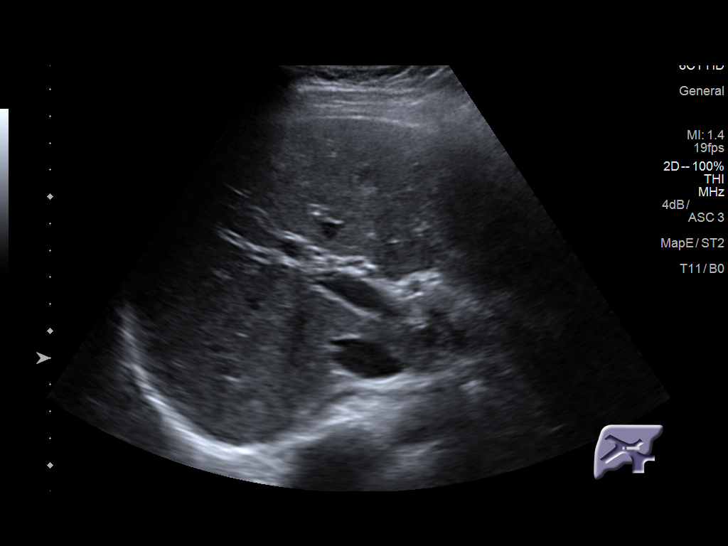
[im 43/113]
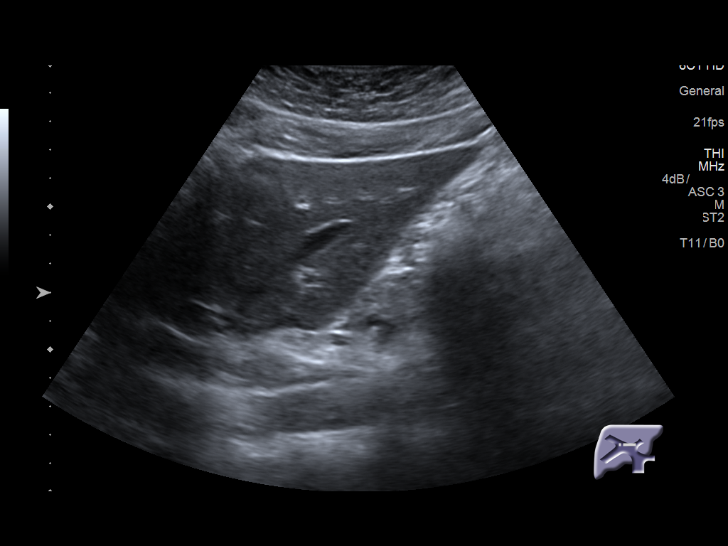
[im 52/113]
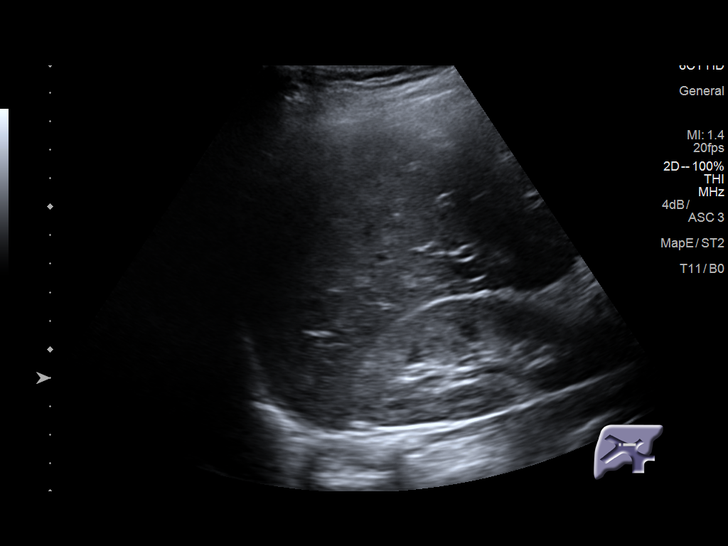
[im 61/113]
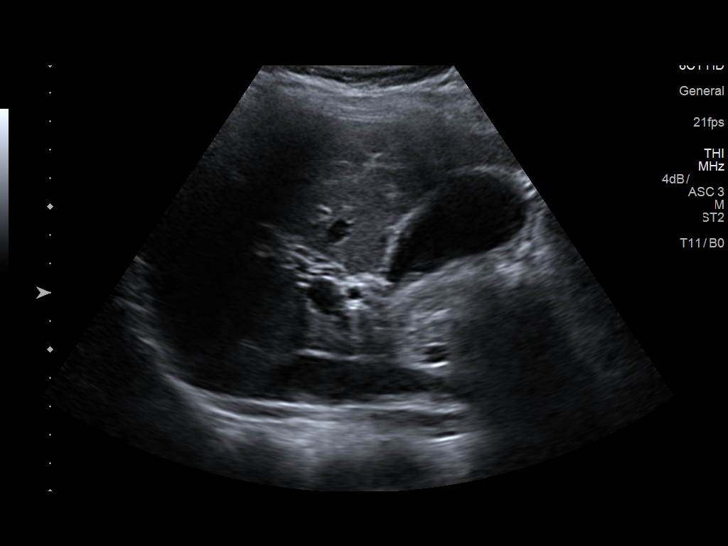
[im 71/113]
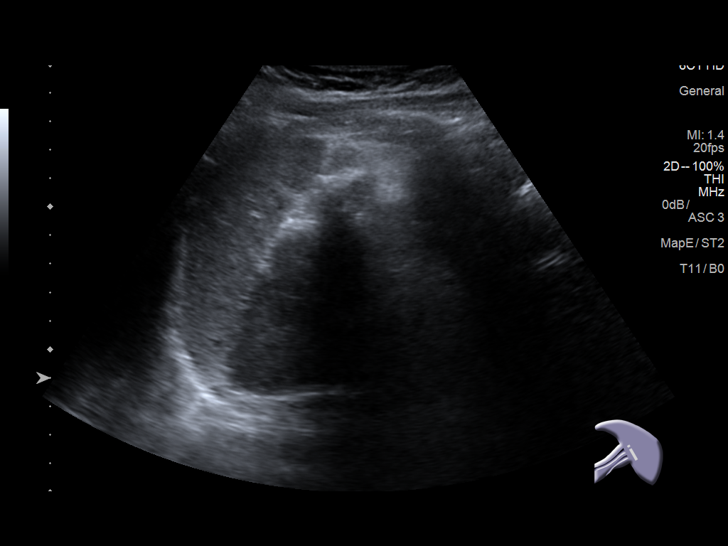
[im 75/113]
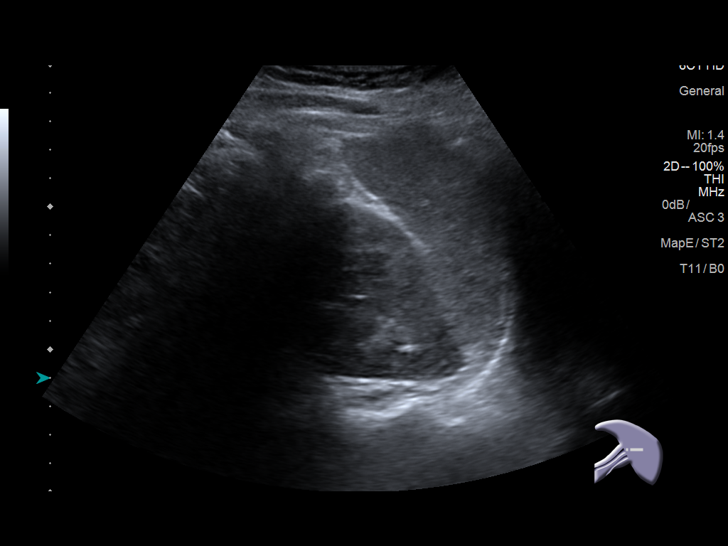
[im 85/113]
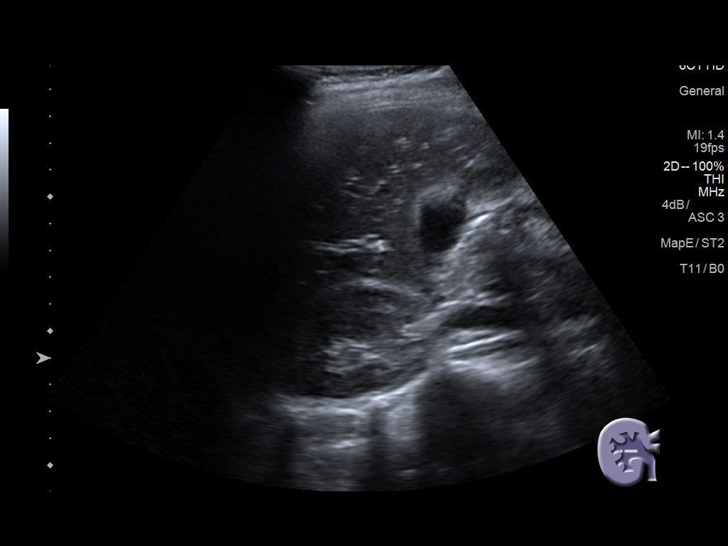
[im 94/113]
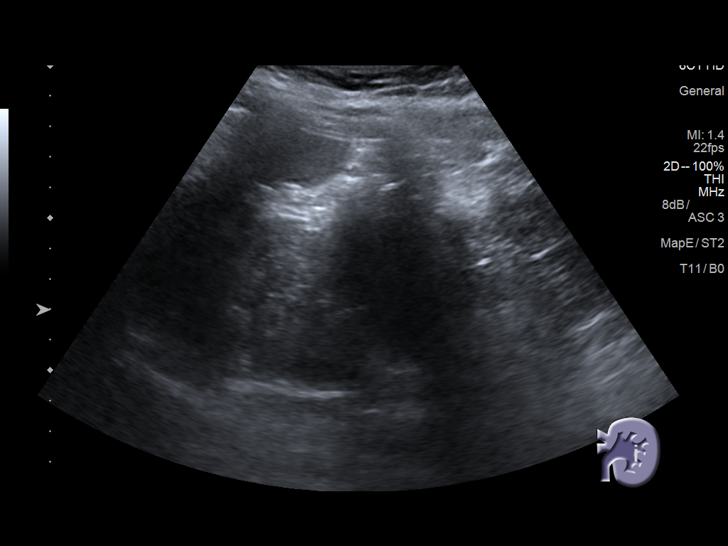
[im 103/113]
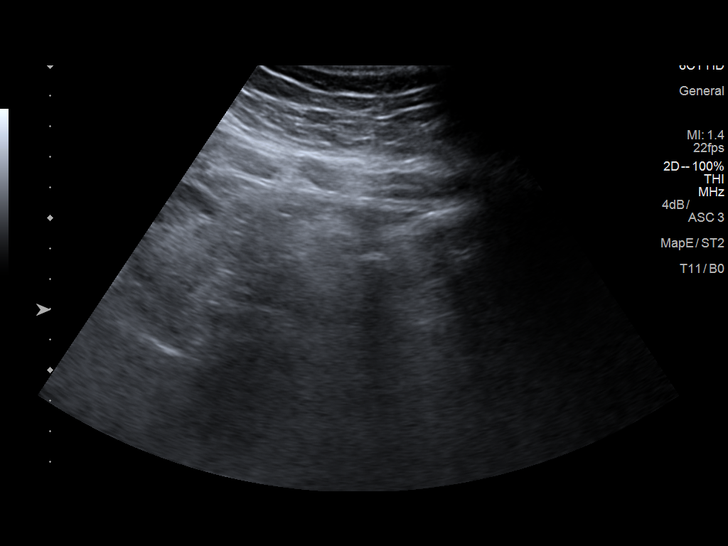
[im 113/113]
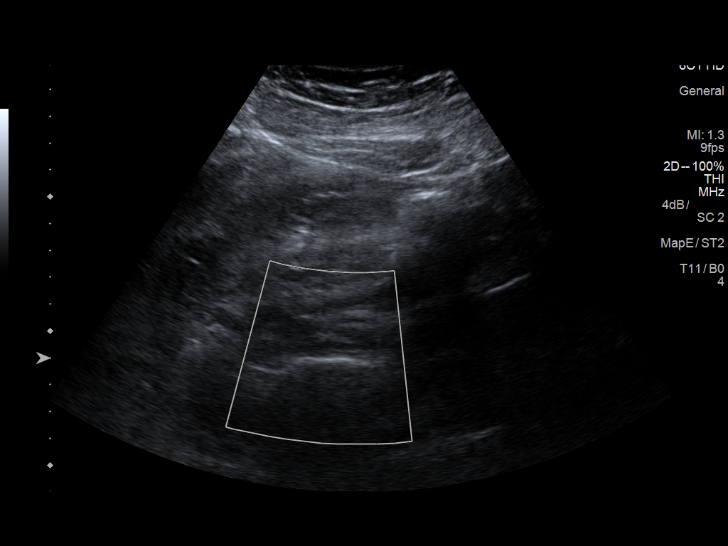

[14 of 25 positions shown; findings below may reference images not displayed]

FINDINGS: Gallbladder: No gallstones or wall thickening visualized. No
sonographic Murphy sign noted.

Common bile duct: Diameter: 4.9 mm

Liver: No focal lesion identified. Within normal limits in
parenchymal echogenicity.

IVC: No abnormality visualized.

Pancreas: Visualized portion unremarkable.

Spleen: Size and appearance within normal limits.

Right Kidney: Length: 9.8 cm. Echogenicity within normal limits. No
mass or hydronephrosis visualized.

Left Kidney: Length: 9.5 cm. Echogenicity within normal limits. No
mass or hydronephrosis visualized.

Abdominal aorta: No aneurysm visualized.

Other findings: None.
IMPRESSION: Normal abdominal ultrasound.

## 2016-09-13 ENCOUNTER — Encounter (HOSPITAL_COMMUNITY): Payer: Self-pay | Admitting: *Deleted

## 2016-09-13 ENCOUNTER — Inpatient Hospital Stay (HOSPITAL_COMMUNITY)
Admission: AD | Admit: 2016-09-13 | Discharge: 2016-09-13 | Disposition: A | Discharge status: Home / Self Care | Payer: BC Managed Care – PPO | Source: Ambulatory Visit | Attending: Obstetrics and Gynecology | Admitting: Obstetrics and Gynecology

## 2016-09-13 ENCOUNTER — Inpatient Hospital Stay (HOSPITAL_COMMUNITY): Payer: BC Managed Care – PPO

## 2016-09-13 DIAGNOSIS — O26891 Other specified pregnancy related conditions, first trimester: Secondary | ICD-10-CM

## 2016-09-13 DIAGNOSIS — O3680X0 Pregnancy with inconclusive fetal viability, not applicable or unspecified: Secondary | ICD-10-CM | POA: Insufficient documentation

## 2016-09-13 DIAGNOSIS — Z833 Family history of diabetes mellitus: Secondary | ICD-10-CM | POA: Insufficient documentation

## 2016-09-13 DIAGNOSIS — N840 Polyp of corpus uteri: Secondary | ICD-10-CM | POA: Diagnosis not present

## 2016-09-13 DIAGNOSIS — Z87891 Personal history of nicotine dependence: Secondary | ICD-10-CM | POA: Diagnosis not present

## 2016-09-13 DIAGNOSIS — O26899 Other specified pregnancy related conditions, unspecified trimester: Secondary | ICD-10-CM

## 2016-09-13 DIAGNOSIS — O26892 Other specified pregnancy related conditions, second trimester: Secondary | ICD-10-CM | POA: Insufficient documentation

## 2016-09-13 DIAGNOSIS — Z3A01 Less than 8 weeks gestation of pregnancy: Secondary | ICD-10-CM | POA: Diagnosis not present

## 2016-09-13 DIAGNOSIS — N760 Acute vaginitis: Secondary | ICD-10-CM | POA: Diagnosis not present

## 2016-09-13 DIAGNOSIS — B9689 Other specified bacterial agents as the cause of diseases classified elsewhere: Secondary | ICD-10-CM

## 2016-09-13 DIAGNOSIS — O209 Hemorrhage in early pregnancy, unspecified: Secondary | ICD-10-CM | POA: Insufficient documentation

## 2016-09-13 DIAGNOSIS — O23591 Infection of other part of genital tract in pregnancy, first trimester: Secondary | ICD-10-CM | POA: Diagnosis not present

## 2016-09-13 DIAGNOSIS — Z8719 Personal history of other diseases of the digestive system: Secondary | ICD-10-CM | POA: Diagnosis not present

## 2016-09-13 DIAGNOSIS — Z8249 Family history of ischemic heart disease and other diseases of the circulatory system: Secondary | ICD-10-CM | POA: Diagnosis not present

## 2016-09-13 DIAGNOSIS — Z823 Family history of stroke: Secondary | ICD-10-CM | POA: Insufficient documentation

## 2016-09-13 DIAGNOSIS — R1030 Lower abdominal pain, unspecified: Secondary | ICD-10-CM | POA: Diagnosis not present

## 2016-09-13 DIAGNOSIS — Z8619 Personal history of other infectious and parasitic diseases: Secondary | ICD-10-CM | POA: Diagnosis not present

## 2016-09-13 DIAGNOSIS — G43909 Migraine, unspecified, not intractable, without status migrainosus: Secondary | ICD-10-CM | POA: Insufficient documentation

## 2016-09-13 DIAGNOSIS — R109 Unspecified abdominal pain: Secondary | ICD-10-CM

## 2016-09-13 DIAGNOSIS — N870 Mild cervical dysplasia: Secondary | ICD-10-CM | POA: Insufficient documentation

## 2016-09-13 DIAGNOSIS — Z3201 Encounter for pregnancy test, result positive: Secondary | ICD-10-CM | POA: Insufficient documentation

## 2016-09-13 LAB — CBC
HCT: 40.2 % (ref 36.0–46.0)
HEMOGLOBIN: 13.5 g/dL (ref 12.0–15.0)
MCH: 27.3 pg (ref 26.0–34.0)
MCHC: 33.6 g/dL (ref 30.0–36.0)
MCV: 81.2 fL (ref 78.0–100.0)
Platelets: 282 10*3/uL (ref 150–400)
RBC: 4.95 MIL/uL (ref 3.87–5.11)
RDW: 13.8 % (ref 11.5–15.5)
WBC: 5.3 10*3/uL (ref 4.0–10.5)

## 2016-09-13 LAB — HCG, QUANTITATIVE, PREGNANCY: HCG, BETA CHAIN, QUANT, S: 74 m[IU]/mL — AB (ref <5)

## 2016-09-13 LAB — WET PREP, GENITAL
Sperm: NONE SEEN
TRICH WET PREP: NONE SEEN
YEAST WET PREP: NONE SEEN

## 2016-09-13 LAB — ABO/RH: ABO/RH(D): O POS

## 2016-09-13 LAB — URINALYSIS, ROUTINE W REFLEX MICROSCOPIC
Bilirubin Urine: NEGATIVE
Glucose, UA: NEGATIVE mg/dL
Hgb urine dipstick: NEGATIVE
Ketones, ur: NEGATIVE mg/dL
LEUKOCYTES UA: NEGATIVE
Nitrite: NEGATIVE
PROTEIN: NEGATIVE mg/dL
Specific Gravity, Urine: >1.03 — ABNORMAL HIGH (ref 1.005–1.030)
pH: 6 (ref 5.0–8.0)

## 2016-09-13 LAB — POCT PREGNANCY, URINE: PREG TEST UR: POSITIVE — AB

## 2016-09-13 MED ORDER — METRONIDAZOLE 500 MG PO TABS: 500.0000 mg | ORAL_TABLET | Freq: Two times a day (BID) | ORAL | 0 refills | Status: DC

## 2016-09-13 NOTE — Discharge Instructions (Signed)
Abdominal Pain During Pregnancy  Abdominal pain is common in pregnancy. Most of the time, it does not cause harm. There are many causes of abdominal pain. Some causes are more serious than others and sometimes the cause is not known. Abdominal pain can be a sign that something is very wrong with the pregnancy or the pain may have nothing to do with the pregnancy. Always tell your health care provider if you have any abdominal pain.  Follow these instructions at home:  · Do not have sex or put anything in your vagina until your symptoms go away completely.  · Watch your abdominal pain for any changes.  · Get plenty of rest until your pain improves.  · Drink enough fluid to keep your urine clear or pale yellow.  · Take over-the-counter or prescription medicines only as told by your health care provider.  · Keep all follow-up visits as told by your health care provider. This is important.  Contact a health care provider if:  · You have a fever.  · Your pain gets worse or you have cramping.  · Your pain continues after resting.  Get help right away if:  · You are bleeding, leaking fluid, or passing tissue from the vagina.  · You have vomiting or diarrhea that does not go away.  · You have painful or bloody urination.  · You notice a decrease in your baby's movements.  · You feel very weak or faint.  · You have shortness of breath.  · You develop a severe headache with abdominal pain.  · You have abnormal vaginal discharge with abdominal pain.  This information is not intended to replace advice given to you by your health care provider. Make sure you discuss any questions you have with your health care provider.  Document Released: 10/06/2005 Document Revised: 07/17/2016 Document Reviewed: 05/05/2013  [employer]vier Interactive Patient Education © 2017 [employer]

## 2016-09-13 NOTE — MAU Provider Note (Signed)
History     CSN: [id number]  Arrival date and time: 09/13/16 [id number]   First Provider Initiated Contact with Patient 09/13/16 [phone]       Chief Complaint  Patient presents with  . Abdominal Cramping  . Possible Pregnancy   HPI  Francena Orecchio is a 30 y.o. G1P0 at [redacted]w[redacted]d by LMP who presents with abdominal pain and vaginal bleeding. Reports several episode of red spotting on toilet paper yesterday. No bleeding today.  Lower abdominal cramping since this morning. Pain comes & goes & is worse in LLQ. Rates pain 3/10. Has not treated. Nothing makes worse.  Denies vomiting, diarrhea, constipation, dysuria, vaginal discharge, or fever. Some nausea. No recent intercourse.    OB History    Gravida Para Term Preterm AB Living   1             SAB TAB Ectopic Multiple Live Births                  Past Medical History:  Diagnosis Date  . Anxiety   . 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 Colitis   . Endometrial polyp   . History of stomach ulcers 2010  . Menometrorrhagia 12/14/2008  . Migraines    "I take RX daily; still have one once/wk" (10/05/2015)  . STD (sexually transmitted disease) 2010   treatment for chlamydia    Past Surgical History:  Procedure Laterality Date  . COLPOSCOPY    . COMBINED HYSTEROSCOPY DIAGNOSTIC / D&C  2013  . DILATATION & CURETTAGE/HYSTEROSCOPY WITH TRUECLEAR N/A 06/09/2013   Procedure: DILATATION & CURETTAGE/HYSTEROSCOPY WITH TRUECLEAR;  Surgeon: Terrance Mass, MD;  Location: [facility];  Service: Gynecology;  Laterality: N/A;  INSERVICE BY JASON    Family History  Problem Relation Age of Onset  . Diabetes Father   . Hypertension Father   . Diabetes Paternal Grandfather   . Heart disease Paternal Grandfather   . Stroke Maternal Grandfather   . Hypertension Paternal Grandmother     Social History  Substance Use Topics  . Smoking status: Former Smoker    Years: 3.00    Types: Cigars    Quit date: 12/15/2011  . Smokeless  tobacco: Never Used  . Alcohol use No    Allergies: No Known Allergies  Prescriptions Prior to Admission  Medication Sig Dispense Refill Last Dose  . albuterol (PROVENTIL HFA;VENTOLIN HFA) 108 (90 BASE) MCG/ACT inhaler Inhale 2 puffs into the lungs every 6 (six) hours as needed. For wheezing   rescue  . fluconazole (DIFLUCAN) 150 MG tablet Take 1 tablet (150 mg total) by mouth once. Take if yeast infection occurs 1 tablet 0   . HYDROcodone-acetaminophen (NORCO/VICODIN) 5-325 MG tablet Take 1-2 tablets by mouth every 4 (four) hours as needed for severe pain. 30 tablet 0   . omeprazole (PRILOSEC) 20 MG capsule Take 1 capsule (20 mg total) by mouth 2 (two) times daily. 60 capsule 0 10/05/2015 at Unknown time  . ondansetron (ZOFRAN ODT) 4 MG disintegrating tablet Take 1 tablet (4 mg total) by mouth every 8 (eight) hours as needed for nausea. 5 tablet 0   . phentermine (ADIPEX-P) 37.5 MG tablet Take 37.5 mg by mouth daily before breakfast.   10/02/2015  . promethazine (PHENERGAN) 12.5 MG tablet Take 1 tablet (12.5 mg total) by mouth every 6 (six) hours as needed for nausea or vomiting. 30 tablet 0     Review of Systems  Constitutional: Negative for chills and fever.  Gastrointestinal: Positive for abdominal pain and nausea. Negative for constipation, diarrhea and vomiting.  Genitourinary: Negative for dysuria.       + vaginal bleeding (yesterday)   Physical Exam   Blood pressure 126/84, pulse 85, temperature 98.8 F (37.1 C), temperature source Oral, resp. rate 16, height 5\' 3"  (1.6 m), weight 163 lb (73.9 kg), last menstrual period 08/07/2016.  Physical Exam  Nursing note and vitals reviewed. Constitutional: She is oriented to person, place, and time. She appears well-developed and well-nourished. No distress.  HENT:  Head: Normocephalic and atraumatic.  Eyes: Conjunctivae are normal. Right eye exhibits no discharge. Left eye exhibits no discharge. No scleral icterus.  Neck: Normal  range of motion.  Cardiovascular: Normal rate, regular rhythm and normal heart sounds.   No murmur heard. Respiratory: Effort normal and breath sounds normal. No respiratory distress. She has no wheezes.  GI: Soft. Normal appearance and bowel sounds are normal. She exhibits no distension. There is tenderness in the left lower quadrant. There is no rigidity, no rebound and no guarding.  Genitourinary: Uterus normal. Cervix exhibits no motion tenderness and no friability. Right adnexum displays no mass and no tenderness. Left adnexum displays no mass and no tenderness. No bleeding in the vagina. Vaginal discharge (small amount of thin white frothy discharge) found.  Genitourinary Comments: Cervix closed  Neurological: She is alert and oriented to person, place, and time.  Skin: Skin is warm and dry. She is not diaphoretic.  Psychiatric: She has a normal mood and affect. Her behavior is normal. Judgment and thought content normal.    MAU Course  Procedures Results for orders placed or performed during the hospital encounter of 09/13/16 (from the past 24 hour(s))  Urinalysis, Routine w reflex microscopic (not at [facility])     Status: Abnormal   Collection Time: 09/13/16  8:45 AM  Result Value Ref Range   Color, Urine YELLOW YELLOW   APPearance CLEAR CLEAR   Specific Gravity, Urine >1.030 (H) 1.005 - 1.030   pH 6.0 5.0 - 8.0   Glucose, UA NEGATIVE NEGATIVE mg/dL   Hgb urine dipstick NEGATIVE NEGATIVE   Bilirubin Urine NEGATIVE NEGATIVE   Ketones, ur NEGATIVE NEGATIVE mg/dL   Protein, ur NEGATIVE NEGATIVE mg/dL   Nitrite NEGATIVE NEGATIVE   Leukocytes, UA NEGATIVE NEGATIVE  Pregnancy, urine POC     Status: Abnormal   Collection Time: 09/13/16  8:51 AM  Result Value Ref Range   Preg Test, Ur POSITIVE (A) NEGATIVE  CBC     Status: None   Collection Time: 09/13/16  9:20 AM  Result Value Ref Range   WBC 5.3 4.0 - 10.5 K/uL   RBC 4.95 3.87 - 5.11 MIL/uL   Hemoglobin 13.5 12.0 - 15.0 g/dL    HCT 40.2 36.0 - 46.0 %   MCV 81.2 78.0 - 100.0 fL   MCH 27.3 26.0 - 34.0 pg   MCHC 33.6 30.0 - 36.0 g/dL   RDW 13.8 11.5 - 15.5 %   Platelets 282 150 - 400 K/uL  ABO/Rh     Status: None   Collection Time: 09/13/16  9:20 AM  Result Value Ref Range   ABO/RH(D) O POS   hCG, quantitative, pregnancy     Status: Abnormal   Collection Time: 09/13/16  9:20 AM  Result Value Ref Range   hCG, Beta Chain, Quant, S 74 (H) <5 mIU/mL  Wet prep, genital     Status: Abnormal   Collection Time: 09/13/16  9:37 AM  Result Value Ref Range   Yeast Wet Prep HPF POC NONE SEEN NONE SEEN   Trich, Wet Prep NONE SEEN NONE SEEN   Clue Cells Wet Prep HPF POC PRESENT (A) NONE SEEN   WBC, Wet Prep HPF POC FEW (A) NONE SEEN   Sperm NONE SEEN    US Ob Comp Less 14 Wks  Result Date: 09/13/2016 CLINICAL DATA:  Spotting yesterday, cramping now. Quantitative beta HCG 74. EXAM: OBSTETRIC <14 WK [place] AND TRANSVAGINAL OB US TECHNIQUE: Both transabdominal and transvaginal ultrasound examinations were performed for complete evaluation of the gestation as well as the maternal uterus, adnexal regions, and pelvic cul-de-sac. Transvaginal technique was performed to assess early pregnancy. COMPARISON:  None. FINDINGS: Intrauterine gestational sac: None seen. Maternal uterus/adnexae: Uterus appears normal. Endometrial complex appears homogeneous in thickness throughout. No mass or fluid identified within the endometrial canal. No free fluid in the cul-de-sac. Both ovaries appear normal and there is no mass or free fluid identified in either adnexal region. IMPRESSION: Normal pelvic ultrasound. Uterus and ovaries appear normal. No intrauterine pregnancy demonstrated. No mass or free fluid identified in either adnexal region. Consider follow-up with serial beta HCG levels and additional pelvic ultrasound as needed. Electronically Signed   By: Franki Cabot M.D.   On: 09/13/2016 10:58   US Ob Transvaginal  Result Date: 09/13/2016 CLINICAL  DATA:  Spotting yesterday, cramping now. Quantitative beta HCG 74. EXAM: OBSTETRIC <14 WK [place] AND TRANSVAGINAL OB US TECHNIQUE: Both transabdominal and transvaginal ultrasound examinations were performed for complete evaluation of the gestation as well as the maternal uterus, adnexal regions, and pelvic cul-de-sac. Transvaginal technique was performed to assess early pregnancy. COMPARISON:  None. FINDINGS: Intrauterine gestational sac: None seen. Maternal uterus/adnexae: Uterus appears normal. Endometrial complex appears homogeneous in thickness throughout. No mass or fluid identified within the endometrial canal. No free fluid in the cul-de-sac. Both ovaries appear normal and there is no mass or free fluid identified in either adnexal region. IMPRESSION: Normal pelvic ultrasound. Uterus and ovaries appear normal. No intrauterine pregnancy demonstrated. No mass or free fluid identified in either adnexal region. Consider follow-up with serial beta HCG levels and additional pelvic ultrasound as needed. Electronically Signed   By: Franki Cabot M.D.   On: 09/13/2016 10:58    MDM +UPT UA, wet prep, GC/chlamydia, CBC, ABO/Rh, quant hCG, HIV, and [place] today to rule out ectopic pregnancy O positive Ultrasound shows no IUP or adnexal mass; BHCG 74  This abdominal pain & vaginal spotting could represent a normal pregnancy, spontaneous abortion, or even an ectopic pregnancy which can be life-threatening. Cultures were obtained to rule out pelvic infection.   Assessment and Plan  A: 1. Pregnancy of unknown anatomic location   2. Vaginal bleeding in pregnancy, first trimester   3. Abdominal pain affecting pregnancy   4. BV (bacterial vaginosis)    P: Discharge home Return to MAU on Monday for f/u BHCG (can't come to [facility] d/t work) GC/CT pending Rx flagyl Discussed reasons to return to MAU before Monday  Jorje Guild 09/13/2016, 9:03 AM

## 2016-09-13 NOTE — MAU Note (Signed)
Period was late, had some spotting yesterday.  Woke up this morning with cramping.  +HPT this morning.Marland Kitchen  Hx of uterine polyps and fibroids.

## 2016-09-14 LAB — HIV ANTIBODY (ROUTINE TESTING W REFLEX): HIV Screen 4th Generation wRfx: NONREACTIVE

## 2016-09-15 ENCOUNTER — Telehealth: Payer: Self-pay | Admitting: General Practice

## 2016-09-15 ENCOUNTER — Other Ambulatory Visit: Payer: BC Managed Care – PPO | Admitting: General Practice

## 2016-09-15 DIAGNOSIS — O3680X0 Pregnancy with inconclusive fetal viability, not applicable or unspecified: Secondary | ICD-10-CM

## 2016-09-15 LAB — HCG, QUANTITATIVE, PREGNANCY: hCG, Beta Chain, Quant, S: 134 m[IU]/mL — ABNORMAL HIGH (ref <5)

## 2016-09-15 NOTE — Progress Notes (Signed)
Patient here for stat bhcg today. Patient denies pain or bleeding. Told patient I would contact her at home with results. Patient provided contact number of [phone]. Spoke with Dr Roselie Awkward regarding results who recommended follow up bhcg in one week. Will call patients with results.

## 2016-09-15 NOTE — Telephone Encounter (Signed)
Called patient regarding bhcg results & recommended follow up in one week. Patient verbalized understanding & states she can come next week 12/4 @ 3:45. Patient had no questions

## 2016-09-17 LAB — GC/CHLAMYDIA PROBE AMP (~~LOC~~) NOT AT ARMC
CHLAMYDIA, DNA PROBE: NEGATIVE
Neisseria Gonorrhea: NEGATIVE

## 2016-09-22 ENCOUNTER — Encounter (HOSPITAL_COMMUNITY): Payer: Self-pay

## 2016-09-22 ENCOUNTER — Inpatient Hospital Stay (HOSPITAL_COMMUNITY)
Admission: AD | Admit: 2016-09-22 | Discharge: 2016-09-22 | Disposition: A | Discharge status: Home / Self Care | Payer: BC Managed Care – PPO | Source: Ambulatory Visit | Attending: Obstetrics and Gynecology | Admitting: Obstetrics and Gynecology

## 2016-09-22 ENCOUNTER — Other Ambulatory Visit: Payer: BC Managed Care – PPO

## 2016-09-22 ENCOUNTER — Inpatient Hospital Stay (HOSPITAL_COMMUNITY): Payer: BC Managed Care – PPO

## 2016-09-22 DIAGNOSIS — R109 Unspecified abdominal pain: Secondary | ICD-10-CM | POA: Diagnosis not present

## 2016-09-22 DIAGNOSIS — R51 Headache: Secondary | ICD-10-CM | POA: Diagnosis not present

## 2016-09-22 DIAGNOSIS — R1032 Left lower quadrant pain: Secondary | ICD-10-CM | POA: Insufficient documentation

## 2016-09-22 DIAGNOSIS — Z3A01 Less than 8 weeks gestation of pregnancy: Secondary | ICD-10-CM | POA: Diagnosis not present

## 2016-09-22 DIAGNOSIS — Z87891 Personal history of nicotine dependence: Secondary | ICD-10-CM | POA: Diagnosis not present

## 2016-09-22 DIAGNOSIS — O3680X0 Pregnancy with inconclusive fetal viability, not applicable or unspecified: Secondary | ICD-10-CM

## 2016-09-22 DIAGNOSIS — O26891 Other specified pregnancy related conditions, first trimester: Secondary | ICD-10-CM | POA: Insufficient documentation

## 2016-09-22 DIAGNOSIS — O26899 Other specified pregnancy related conditions, unspecified trimester: Secondary | ICD-10-CM

## 2016-09-22 LAB — URINALYSIS, ROUTINE W REFLEX MICROSCOPIC
Bilirubin Urine: NEGATIVE
GLUCOSE, UA: NEGATIVE mg/dL
HGB URINE DIPSTICK: NEGATIVE
KETONES UR: NEGATIVE mg/dL
Nitrite: NEGATIVE
PH: 6.5 (ref 5.0–8.0)
PROTEIN: NEGATIVE mg/dL
Specific Gravity, Urine: 1.01 (ref 1.005–1.030)

## 2016-09-22 LAB — CBC WITH DIFFERENTIAL/PLATELET
BASOS ABS: 0.1 10*3/uL (ref 0.0–0.1)
BASOS PCT: 1 %
EOS PCT: 2 %
Eosinophils Absolute: 0.2 10*3/uL (ref 0.0–0.7)
HEMATOCRIT: 36.2 % (ref 36.0–46.0)
Hemoglobin: 11.8 g/dL — ABNORMAL LOW (ref 12.0–15.0)
LYMPHS PCT: 33 %
Lymphs Abs: 3.8 10*3/uL (ref 0.7–4.0)
MCH: 26.8 pg (ref 26.0–34.0)
MCHC: 32.6 g/dL (ref 30.0–36.0)
MCV: 82.3 fL (ref 78.0–100.0)
MONO ABS: 0.4 10*3/uL (ref 0.1–1.0)
MONOS PCT: 4 %
Neutro Abs: 6.9 10*3/uL (ref 1.7–7.7)
Neutrophils Relative %: 60 %
PLATELETS: 269 10*3/uL (ref 150–400)
RBC: 4.4 MIL/uL (ref 3.87–5.11)
RDW: 14 % (ref 11.5–15.5)
WBC: 11.3 10*3/uL — ABNORMAL HIGH (ref 4.0–10.5)

## 2016-09-22 LAB — URINE MICROSCOPIC-ADD ON

## 2016-09-22 NOTE — MAU Note (Signed)
All day at work, has felt like she had a pin getting stuck into her on the LL side. No bleeding. Yesterday started having diarrhea, x2 today

## 2016-09-22 NOTE — Discharge Instructions (Signed)
Abdominal Pain During Pregnancy °Belly (abdominal) pain is common during pregnancy. Most of the time, it is not a serious problem. Other times, it can be a sign that something is wrong with the pregnancy. Always tell your doctor if you have belly pain. °Follow these instructions at home: °Monitor your belly pain for any changes. The following actions may help you feel better: °· Do not have sex (intercourse) or put anything in your vagina until you feel better. °· Rest until your pain stops. °· Drink clear fluids if you feel sick to your stomach (nauseous). Do not eat solid food until you feel better. °· Only take medicine as told by your doctor. °· Keep all doctor visits as told. °Get help right away if: °· You are bleeding, leaking fluid, or pieces of tissue come out of your vagina. °· You have more pain or cramping. °· You keep throwing up (vomiting). °· You have pain when you pee (urinate) or have blood in your pee. °· You have a fever. °· You do not feel your baby moving as much. °· You feel very weak or feel like passing out. °· You have trouble breathing, with or without belly pain. °· You have a very bad headache and belly pain. °· You have fluid leaking from your vagina and belly pain. °· You keep having watery poop (diarrhea). °· Your belly pain does not go away after resting, or the pain gets worse. °This information is not intended to replace advice given to you by your health care provider. Make sure you discuss any questions you have with your health care provider. °Document Released: 09/24/2009 Document Revised: 05/14/2016 Document Reviewed: 05/05/2013 °Elsevier Interactive Patient Education © 2017 [employer] Inc[employer] ° °

## 2016-09-22 NOTE — MAU Provider Note (Signed)
History     CSN: [id number]  Arrival date and time: 09/22/16 1814   None     Chief Complaint  Patient presents with  . Abdominal Pain   HPI Erika Osborne is 30 y.o. G1P0 [redacted]w[redacted]d weeks presenting with left lower abdominal pain that began this am at work.  She was seen here 11/25 for abdominal pain in LLQ and vaginal bleeding. + UPT, BHCG was 74. ABO RH ) positive and U/S did not see IUP. GC/CHL were negative at that visit. She returned on 11/27 for repeat BHCG which was 134.  Describes pain as "pins sticking" in her left side, comes and goes. Rates as 5/10.  Nothing makes it worse or better.  States she is so worried, she worked herself into a panic.  Concerned because she reports her sister had 5 miscarriages and she wants to know if things are ok.  Neg for vaginal bleeding today.  Had a diarrheal stool yesterday and 2 loose stools today.  Thinks it is something she ate.   Neg for fever.  Headaches at night since pregnant--has not taken anything. She does not have one now.  She is a patient of Dr. Sherran Needs, has not been seen for this pregnancy.  States she knows he is no longer delivering and not sure who she is scheduled to see to begin prenatal care..  Prenatal appt has been scheduled.     Past Medical History:  Diagnosis Date  . Anxiety   . 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 Colitis   . Endometrial polyp   . History of stomach ulcers 2010  . Menometrorrhagia 12/14/2008  . Migraines    "I take RX daily; still have one once/wk" (10/05/2015)  . STD (sexually transmitted disease) 2010   treatment for chlamydia    Past Surgical History:  Procedure Laterality Date  . COLPOSCOPY    . COMBINED HYSTEROSCOPY DIAGNOSTIC / D&C  2013  . DILATATION & CURETTAGE/HYSTEROSCOPY WITH TRUECLEAR N/A 06/09/2013   Procedure: DILATATION & CURETTAGE/HYSTEROSCOPY WITH TRUECLEAR;  Surgeon: Terrance Mass, MD;  Location: [facility];  Service: Gynecology;  Laterality: N/A;  INSERVICE  BY JASON    Family History  Problem Relation Age of Onset  . Diabetes Father   . Hypertension Father   . Diabetes Paternal Grandfather   . Heart disease Paternal Grandfather   . Stroke Maternal Grandfather   . Hypertension Paternal Grandmother     Social History  Substance Use Topics  . Smoking status: Former Smoker    Years: 3.00    Types: Cigars    Quit date: 12/15/2011  . Smokeless tobacco: Never Used  . Alcohol use No    Allergies: No Known Allergies  Prescriptions Prior to Admission  Medication Sig Dispense Refill Last Dose  . albuterol (PROVENTIL HFA;VENTOLIN HFA) 108 (90 BASE) MCG/ACT inhaler Inhale 2 puffs into the lungs every 6 (six) hours as needed. For wheezing   Past Month at Unknown time  . amphetamine-dextroamphetamine (ADDERALL) 10 MG tablet Take 10 mg by mouth daily as needed.    Past Week at Unknown time  . ARIPiprazole (ABILIFY) 2 MG tablet Take 2 mg by mouth daily.    Past Week at Unknown time  . buPROPion (WELLBUTRIN XL) 300 MG 24 hr tablet Take 300 mg by mouth daily.    09/12/2016 at Unknown time  . clonazePAM (KLONOPIN) 0.5 MG tablet Take 0.5 mg by mouth as needed.    Past Month at Unknown time  .  metroNIDAZOLE (FLAGYL) 500 MG tablet Take 1 tablet (500 mg total) by mouth 2 (two) times daily. 14 tablet 0     Review of Systems  Constitutional: Negative for chills and fever.  Gastrointestinal: Positive for abdominal pain (left lower quadrant) and nausea. Negative for vomiting.  Genitourinary: Negative for dysuria, frequency, hematuria and urgency.       Neg for vaginal bleeding.   Neurological: Positive for headaches.  Psychiatric/Behavioral:       "Worries a lot about pregnancy"   Physical Exam   Blood pressure 120/71, pulse 101, temperature 98.8 F (37.1 C), temperature source Oral, resp. rate 16, weight 178 lb 8 oz (81 kg), last menstrual period 08/07/2016.  Physical Exam  Nursing note and vitals reviewed. Constitutional: She is oriented to  person, place, and time. She appears well-developed and well-nourished.  HENT:  Head: Normocephalic.  Neck: Normal range of motion.  Cardiovascular: Normal rate.   Respiratory: Effort normal.  GI: Soft. There is no tenderness. There is no rebound and no guarding.  Genitourinary: There is no rash, tenderness or lesion on the right labia. There is no rash, tenderness or lesion on the left labia. Uterus is enlarged (slightly). Uterus is not tender. Cervix exhibits no motion tenderness, no discharge and no friability. Right adnexum displays no mass, no tenderness and no fullness. Left adnexum displays no mass, no tenderness and no fullness. No bleeding in the vagina. No vaginal discharge found.  Neurological: She is alert and oriented to person, place, and time.  Skin: Skin is warm and dry.  Psychiatric: Her behavior is normal. Thought content normal. Her mood appears anxious.   Results for orders placed or performed during the hospital encounter of 09/22/16 (from the past 24 hour(s))  Urinalysis, Routine w reflex microscopic (not at [facility])     Status: Abnormal   Collection Time: 09/22/16  7:11 PM  Result Value Ref Range   Color, Urine YELLOW YELLOW   APPearance CLEAR CLEAR   Specific Gravity, Urine 1.010 1.005 - 1.030   pH 6.5 5.0 - 8.0   Glucose, UA NEGATIVE NEGATIVE mg/dL   Hgb urine dipstick NEGATIVE NEGATIVE   Bilirubin Urine NEGATIVE NEGATIVE   Ketones, ur NEGATIVE NEGATIVE mg/dL   Protein, ur NEGATIVE NEGATIVE mg/dL   Nitrite NEGATIVE NEGATIVE   Leukocytes, UA TRACE (A) NEGATIVE  Urine microscopic-add on     Status: Abnormal   Collection Time: 09/22/16  7:11 PM  Result Value Ref Range   Squamous Epithelial / LPF 6-30 (A) NONE SEEN   WBC, UA 6-30 0 - 5 WBC/hpf   RBC / HPF 0-5 0 - 5 RBC/hpf   Bacteria, UA RARE (A) NONE SEEN  CBC with Differential/Platelet     Status: Abnormal   Collection Time: 09/22/16  7:43 PM  Result Value Ref Range   WBC 11.3 (H) 4.0 - 10.5 K/uL   RBC 4.40  3.87 - 5.11 MIL/uL   Hemoglobin 11.8 (L) 12.0 - 15.0 g/dL   HCT 36.2 36.0 - 46.0 %   MCV 82.3 78.0 - 100.0 fL   MCH 26.8 26.0 - 34.0 pg   MCHC 32.6 30.0 - 36.0 g/dL   RDW 14.0 11.5 - 15.5 %   Platelets 269 150 - 400 K/uL   Neutrophils Relative % 60 %   Neutro Abs 6.9 1.7 - 7.7 K/uL   Lymphocytes Relative 33 %   Lymphs Abs 3.8 0.7 - 4.0 K/uL   Monocytes Relative 4 %   Monocytes Absolute 0.4  0.1 - 1.0 K/uL   Eosinophils Relative 2 %   Eosinophils Absolute 0.2 0.0 - 0.7 K/uL   Basophils Relative 1 %   Basophils Absolute 0.1 0.0 - 0.1 K/uL   US Ob Transvaginal  Result Date: 09/22/2016 CLINICAL DATA:  Left lower quadrant pain, beta HCG is pending at the time of the study and dictation EXAM: TRANSVAGINAL OB ULTRASOUND TECHNIQUE: Transvaginal ultrasound was performed for complete evaluation of the gestation as well as the maternal uterus, adnexal regions, and pelvic cul-de-sac. COMPARISON:  09/13/2016 FINDINGS: Intrauterine gestational sac: Tiny subtle lucency noted along the fundal aspect. Yolk sac:  Not Visualized. Embryo:  Not Visualized. Cardiac Activity: Not Visualized. Heart Rate: Not applicable MSD: 1.9  mm   too small to date Subchorionic hemorrhage:  None visualized. Maternal uterus/adnexae: The left ovary is unremarkable measuring 2.3 x 3.8 x 1.8 cm. The right ovary measures 2.9 x 1.9 x 3 cm in is unremarkable. The uterus is anteverted without focal mass. IMPRESSION: Tiny subtle lucency measuring 1.9 mm mean sac diameter along the fundal portion. No definite double decidual reaction. As findings might represent a very early intrauterine gestational sac, follow-up quantitative B-HCG levels and follow-up US in 14 days to confirm and assess viability is recommended. This recommendation follows SRU consensus guidelines: [employer] Med 2013; C5999891. Electronically Signed   By: Ashley Royalty M.D.   On: 09/22/2016 20:16   MAU Course  Procedures  MDM MSE Labs U/S Exam  Assessment and Plan   A:  Left lower quadrant pain first trimester pregnancy       Per U/S-- 1.9 mm sac diameter in fundal portion-could represent a very early gestational sac        P:   Has scheduled appointment to see [facility] this Thursday, Dec 7 to begin prenatal care        Instructed to return to MAU for worsening sxs    Talked to her about calming herself down when she gets anxious to prevent "attacks"  She agreed.      May take Tylenol for headaches.    Rolla Kedzierski,EVE M 09/22/2016, 7:11 PM

## 2016-09-22 NOTE — MAU Note (Signed)
Pt confirms, the wt's were correct.  States she had been taking diet pills and is no longer taking them.

## 2016-09-23 ENCOUNTER — Telehealth: Payer: Self-pay

## 2016-09-23 LAB — HCG, QUANTITATIVE, PREGNANCY: hCG, Beta Chain, Quant, S: 766.8 m[IU]/mL — ABNORMAL HIGH

## 2016-09-23 NOTE — Telephone Encounter (Signed)
Patient called requesting quant level results. Patient has been given her results and has a follow up scheduled Dec 7 with [facility].

## 2016-09-25 ENCOUNTER — Inpatient Hospital Stay (HOSPITAL_COMMUNITY)
Admission: AD | Admit: 2016-09-25 | Discharge: 2016-09-25 | Disposition: A | Discharge status: Home / Self Care | Payer: BC Managed Care – PPO | Source: Ambulatory Visit | Attending: Obstetrics and Gynecology | Admitting: Obstetrics and Gynecology

## 2016-09-25 ENCOUNTER — Encounter (HOSPITAL_COMMUNITY): Payer: Self-pay | Admitting: *Deleted

## 2016-09-25 ENCOUNTER — Inpatient Hospital Stay (HOSPITAL_COMMUNITY): Payer: BC Managed Care – PPO

## 2016-09-25 DIAGNOSIS — O009 Unspecified ectopic pregnancy without intrauterine pregnancy: Secondary | ICD-10-CM | POA: Diagnosis not present

## 2016-09-25 DIAGNOSIS — O00201 Right ovarian pregnancy without intrauterine pregnancy: Secondary | ICD-10-CM

## 2016-09-25 DIAGNOSIS — O26891 Other specified pregnancy related conditions, first trimester: Secondary | ICD-10-CM

## 2016-09-25 DIAGNOSIS — R102 Pelvic and perineal pain: Secondary | ICD-10-CM | POA: Insufficient documentation

## 2016-09-25 DIAGNOSIS — D259 Leiomyoma of uterus, unspecified: Secondary | ICD-10-CM | POA: Insufficient documentation

## 2016-09-25 DIAGNOSIS — Z3A00 Weeks of gestation of pregnancy not specified: Secondary | ICD-10-CM | POA: Insufficient documentation

## 2016-09-25 DIAGNOSIS — O3411 Maternal care for benign tumor of corpus uteri, first trimester: Secondary | ICD-10-CM | POA: Insufficient documentation

## 2016-09-25 LAB — CBC WITH DIFFERENTIAL/PLATELET
BASOS PCT: 1 %
Basophils Absolute: 0.1 10*3/uL (ref 0.0–0.1)
EOS ABS: 0.1 10*3/uL (ref 0.0–0.7)
Eosinophils Relative: 2 %
HEMATOCRIT: 38.6 % (ref 36.0–46.0)
Hemoglobin: 12.5 g/dL (ref 12.0–15.0)
LYMPHS ABS: 2.5 10*3/uL (ref 0.7–4.0)
Lymphocytes Relative: 38 %
MCH: 26.8 pg (ref 26.0–34.0)
MCHC: 32.4 g/dL (ref 30.0–36.0)
MCV: 82.7 fL (ref 78.0–100.0)
MONO ABS: 0.3 10*3/uL (ref 0.1–1.0)
MONOS PCT: 5 %
NEUTROS ABS: 3.7 10*3/uL (ref 1.7–7.7)
Neutrophils Relative %: 54 %
Platelets: 288 10*3/uL (ref 150–400)
RBC: 4.67 MIL/uL (ref 3.87–5.11)
RDW: 13.8 % (ref 11.5–15.5)
WBC: 6.7 10*3/uL (ref 4.0–10.5)

## 2016-09-25 LAB — CREATININE, SERUM
Creatinine, Ser: 0.75 mg/dL (ref 0.44–1.00)
GFR calc non Af Amer: >60 mL/min (ref >60)

## 2016-09-25 LAB — HCG, QUANTITATIVE, PREGNANCY: hCG, Beta Chain, Quant, S: 1809 m[IU]/mL — ABNORMAL HIGH (ref <5)

## 2016-09-25 LAB — AST: AST: 21 U/L (ref 15–41)

## 2016-09-25 LAB — BUN: BUN: 12 mg/dL (ref 6–20)

## 2016-09-25 MED ORDER — METHOTREXATE INJECTION FOR WOMEN'S HOSPITAL
50.0000 mg/m2 | Freq: Once | INTRAMUSCULAR | Status: AC
  Administered 2016-09-25: 95 mg via INTRAMUSCULAR
  Filled 2016-09-25: qty 1.9

## 2016-09-25 MED ORDER — METHOTREXATE INJECTION FOR WOMEN'S HOSPITAL: 50.0000 mg/m2 | Freq: Once | INTRAMUSCULAR | Status: DC

## 2016-09-25 MED ORDER — ACETAMINOPHEN 325 MG PO TABS
650.0000 mg | ORAL_TABLET | Freq: Once | ORAL | Status: AC
  Administered 2016-09-25: 650 mg via ORAL
  Filled 2016-09-25: qty 2

## 2016-09-25 NOTE — Discharge Instructions (Signed)
Ectopic Pregnancy An ectopic pregnancy happens when a fertilized egg grows outside the uterus. A pregnancy cannot live outside of the uterus. This problem often happens in the fallopian tube. It is often caused by damage to the fallopian tube. If this problem is found early, you may be treated with medicine. If your tube tears or bursts open (ruptures), you will bleed inside. This is an emergency. You will need surgery. Get help right away. What are the signs or symptoms? You may have normal pregnancy symptoms at first. These include:  Missing your period.  Feeling sick to your stomach (nauseous).  Being tired.  Having tender breasts. Then, you may start to have symptoms that are not normal. These include:  Pain with sex (intercourse).  Bleeding from the vagina. This includes light bleeding (spotting).  Belly (abdomen) or lower belly cramping or pain. This may be felt on one side.  A fast heartbeat (pulse).  Passing out (fainting) after going poop (bowel movement). If your tube tears, you may have symptoms such as:  Really bad pain in the belly or lower belly. This happens suddenly.  Dizziness.  Passing out.  Shoulder pain. Get help right away if: You have any of these symptoms. This is an emergency. This information is not intended to replace advice given to you by your health care provider. Make sure you discuss any questions you have with your health care provider. Document Released: 01/02/2009 Document Revised: 03/13/2016 Document Reviewed: 05/18/2013 Elsevier Interactive Patient Education  2017 [place]. Methotrexate Treatment for an Ectopic Pregnancy, Care After Refer to this sheet in the next few weeks. These instructions provide you with information on caring for yourself after your procedure. Your health care provider may also give you more specific instructions. Your treatment has been planned according to current medical practices, but problems sometimes occur.  Call your health care provider if you have any problems or questions after your procedure. WHAT TO EXPECT AFTER THE PROCEDURE You may have some abdominal cramping, vaginal bleeding, and fatigue in the first few days after taking methotrexate. Some other possible side effects of methotrexate include:  Nausea.  Vomiting.  Diarrhea.  Mouth sores.  Swelling or irritation of the lining of your lungs (pneumonitis).  Liver damage.  Hair loss. HOME CARE INSTRUCTIONS  After you have received the methotrexate medicine, you need to be careful of your activities and watch your condition for several weeks. It may take 1 week before your hormone levels return to normal.  Keep all follow-up appointments as directed by your health care provider.  Avoid traveling too far away from your health care provider.  Do not have sexual intercourse until your health care provider says it is safe to do so.  You may resume your usual diet.  Limit strenuous activity.  Do not take folic acid, prenatal vitamins, or other vitamins that contain folic acid.  Do not take aspirin, ibuprofen, or naproxen (nonsteroidal anti-inflammatory drugs [NSAIDs]).  Do not drink alcohol. SEEK MEDICAL CARE IF:   You cannot control your nausea and vomiting.  You cannot control your diarrhea.  You have sores in your mouth and want treatment.  You need pain medicine for your abdominal pain.  You have a rash.  You are having a reaction to the medicine. SEEK IMMEDIATE MEDICAL CARE IF:   You have increasing abdominal or pelvic pain.  You notice increased bleeding.  You feel light-headed, or you faint.  You have shortness of breath.  Your heart rate increases.  You have a cough.  You have chills.  You have a fever. This information is not intended to replace advice given to you by your health care provider. Make sure you discuss any questions you have with your health care provider. Document Released:  09/25/2011 Document Revised: 10/11/2013 Document Reviewed: 07/25/2013 Elsevier Interactive Patient Education  2017 [employer].

## 2016-09-25 NOTE — MAU Provider Note (Signed)
Chief Complaint: Ectopic Pregnancy   None       SUBJECTIVE HPI: Erika Osborne is a 30 y.o. G1P0 at [redacted]w[redacted]d by LMP who presents to maternity admissions reporting suspected ectopic pregnancy.  Was sent by Dr Radene Knee for Methotrexate.  Patient is very tearful and grieving. States they told her in 2013 she could never get pregnant. . She denies vaginal bleeding, vaginal itching/burning, urinary symptoms, h/a, dizziness, n/v, or fever/chills.    She is requesting another [place] to confirm before proceeding  Abdominal Pain  This is a new problem. The current episode started in the past 7 days. The onset quality is gradual. The problem occurs intermittently. The problem has been unchanged. The pain is located in the suprapubic region. The pain is moderate. The quality of the pain is aching and cramping. The abdominal pain does not radiate. Pertinent negatives include no dysuria or fever.   RN Note: Pt sent from MD office for MTX, pt very upset & crying.  Past Medical History:  Diagnosis Date  . Anxiety   . 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 Colitis   . Endometrial polyp   . History of stomach ulcers 2010  . Menometrorrhagia 12/14/2008  . Migraines    "I take RX daily; still have one once/wk" (10/05/2015)  . STD (sexually transmitted disease) 2010   treatment for chlamydia   Past Surgical History:  Procedure Laterality Date  . COLPOSCOPY    . COMBINED HYSTEROSCOPY DIAGNOSTIC / D&C  2013  . DILATATION & CURETTAGE/HYSTEROSCOPY WITH TRUECLEAR N/A 06/09/2013   Procedure: DILATATION & CURETTAGE/HYSTEROSCOPY WITH TRUECLEAR;  Surgeon: Terrance Mass, MD;  Location: [facility];  Service: Gynecology;  Laterality: N/A;  INSERVICE BY JASON   Social History   Social History  . Marital status: Single    Spouse name: N/A  . Number of children: N/A  . Years of education: N/A   Occupational History  . Not on file.   Social History Main Topics  . Smoking status: Former Smoker     Years: 3.00    Types: Cigars    Quit date: 12/15/2011  . Smokeless tobacco: Never Used  . Alcohol use No  . Drug use:     Types: Marijuana     Comment: last was last wk, 2/month  . Sexual activity: Not Currently    Birth control/ protection: Condom   Other Topics Concern  . Not on file   Social History Narrative  . No narrative on file   No current facility-administered medications on file prior to encounter.    Current Outpatient Prescriptions on File Prior to Encounter  Medication Sig Dispense Refill  . albuterol (PROVENTIL HFA;VENTOLIN HFA) 108 (90 BASE) MCG/ACT inhaler Inhale 2 puffs into the lungs every 6 (six) hours as needed. For wheezing     No Known Allergies  I have reviewed patient's Past Medical Hx, Surgical Hx, Family Hx, Social Hx, medications and allergies.   ROS:  Review of Systems  Constitutional: Negative for chills and fever.  Respiratory: Negative for shortness of breath.   Gastrointestinal: Positive for abdominal pain.  Genitourinary: Positive for pelvic pain. Negative for dysuria, vaginal bleeding, vaginal discharge and vaginal pain.   Review of Systems  Other systems negative   Physical Exam  Physical Exam Patient Vitals for the past 24 hrs:  BP Temp Temp src Pulse Resp Height Weight  09/25/16 1105 - - - - - 5\' 3"  (1.6 m) 175 lb (79.4 kg)  09/25/16  1030 137/69 98.9 F (37.2 C) Oral 95 20 - -   Constitutional: Well-developed, well-nourished female in no acute distress, but crying.  Cardiovascular: normal rate Respiratory: normal effort GI: Abd soft, non-tender. Pos BS x 4 MS: Extremities nontender, no edema, normal ROM Neurologic: Alert and oriented x 4.  GU: Neg CVAT.  PELVIC EXAM: deferred  LAB RESULTS Results for orders placed or performed during the hospital encounter of 09/25/16 (from the past 24 hour(s))  hCG, quantitative, pregnancy     Status: Abnormal   Collection Time: 09/25/16 10:41 AM  Result Value Ref Range   hCG, Beta  Chain, Quant, S 1,809 (H) <5 mIU/mL  CBC WITH DIFFERENTIAL     Status: None   Collection Time: 09/25/16 10:41 AM  Result Value Ref Range   WBC 6.7 4.0 - 10.5 K/uL   RBC 4.67 3.87 - 5.11 MIL/uL   Hemoglobin 12.5 12.0 - 15.0 g/dL   HCT 38.6 36.0 - 46.0 %   MCV 82.7 78.0 - 100.0 fL   MCH 26.8 26.0 - 34.0 pg   MCHC 32.4 30.0 - 36.0 g/dL   RDW 13.8 11.5 - 15.5 %   Platelets 288 150 - 400 K/uL   Neutrophils Relative % 54 %   Neutro Abs 3.7 1.7 - 7.7 K/uL   Lymphocytes Relative 38 %   Lymphs Abs 2.5 0.7 - 4.0 K/uL   Monocytes Relative 5 %   Monocytes Absolute 0.3 0.1 - 1.0 K/uL   Eosinophils Relative 2 %   Eosinophils Absolute 0.1 0.0 - 0.7 K/uL   Basophils Relative 1 %   Basophils Absolute 0.1 0.0 - 0.1 K/uL  AST     Status: None   Collection Time: 09/25/16 10:41 AM  Result Value Ref Range   AST 21 15 - 41 U/L  BUN     Status: None   Collection Time: 09/25/16 10:41 AM  Result Value Ref Range   BUN 12 6 - 20 mg/dL  Creatinine, serum     Status: None   Collection Time: 09/25/16 10:41 AM  Result Value Ref Range   Creatinine, Ser 0.75 0.44 - 1.00 mg/dL   GFR calc non Af Amer >60 >60 mL/min   GFR calc Af Amer >60 >60 mL/min    --/--/O POS (11/25 0920)  IMAGING US Ob Comp Less 14 Wks  Result Date: 09/13/2016 CLINICAL DATA:  Spotting yesterday, cramping now. Quantitative beta HCG 74. EXAM: OBSTETRIC <14 WK [place] AND TRANSVAGINAL OB US TECHNIQUE: Both transabdominal and transvaginal ultrasound examinations were performed for complete evaluation of the gestation as well as the maternal uterus, adnexal regions, and pelvic cul-de-sac. Transvaginal technique was performed to assess early pregnancy. COMPARISON:  None. FINDINGS: Intrauterine gestational sac: None seen. Maternal uterus/adnexae: Uterus appears normal. Endometrial complex appears homogeneous in thickness throughout. No mass or fluid identified within the endometrial canal. No free fluid in the cul-de-sac. Both ovaries appear  normal and there is no mass or free fluid identified in either adnexal region. IMPRESSION: Normal pelvic ultrasound. Uterus and ovaries appear normal. No intrauterine pregnancy demonstrated. No mass or free fluid identified in either adnexal region. Consider follow-up with serial beta HCG levels and additional pelvic ultrasound as needed. Electronically Signed   By: Franki Cabot M.D.   On: 09/13/2016 10:58   US Ob Transvaginal  Addendum Date: 09/25/2016   ADDENDUM REPORT: 09/25/2016 14:38 ADDENDUM: I was unable to reach Hansel Feinstein. These results were communicated directly to Dr. Louretta Shorten at 2:37 p.m.  on 09/25/2016. Electronically Signed   By: Rolm Baptise M.D.   On: 09/25/2016 14:38   Result Date: 09/25/2016 CLINICAL DATA:  Pelvic pain affecting first trimester pregnancy EXAM: TRANSVAGINAL OB ULTRASOUND TECHNIQUE: Transvaginal ultrasound was performed for complete evaluation of the gestation as well as the maternal uterus, adnexal regions, and pelvic cul-de-sac. COMPARISON:  09/22/2016 FINDINGS: Intrauterine gestational sac: None visualized Yolk sac:  Not visualized Embryo:  Not visualized Cardiac Activity: Heart Rate:  bpm MSD:   mm    w     d CRL:     mm    w  d                  [place] EDC: Subchorionic hemorrhage:  None visualized. Maternal uterus/adnexae: There is a soft tissue mass adjacent to the right ovary measuring 3.2 x 1.3 x 1.2 cm. Appearance is concerning for ectopic pregnancy. Small fibroids noted in the uterus, the largest 9 mm. IMPRESSION: 3.2 x 1.3 x 1.2 cm soft tissue mass in the right adnexa adjacent to the right ovary concerning for ectopic pregnancy. No intrauterine pregnancy. Critical Value/emergent results were called by telephone at the time of interpretation on 09/25/2016 at 2:13 pm to Dr. Hansel Feinstein , who verbally acknowledged these results. Electronically Signed: By: Rolm Baptise M.D. On: 09/25/2016 14:13   US Ob Transvaginal  Result Date: 09/22/2016 CLINICAL DATA:  Left  lower quadrant pain, beta HCG is pending at the time of the study and dictation EXAM: TRANSVAGINAL OB ULTRASOUND TECHNIQUE: Transvaginal ultrasound was performed for complete evaluation of the gestation as well as the maternal uterus, adnexal regions, and pelvic cul-de-sac. COMPARISON:  09/13/2016 FINDINGS: Intrauterine gestational sac: Tiny subtle lucency noted along the fundal aspect. Yolk sac:  Not Visualized. Embryo:  Not Visualized. Cardiac Activity: Not Visualized. Heart Rate: Not applicable MSD: 1.9  mm   too small to date Subchorionic hemorrhage:  None visualized. Maternal uterus/adnexae: The left ovary is unremarkable measuring 2.3 x 3.8 x 1.8 cm. The right ovary measures 2.9 x 1.9 x 3 cm in is unremarkable. The uterus is anteverted without focal mass. IMPRESSION: Tiny subtle lucency measuring 1.9 mm mean sac diameter along the fundal portion. No definite double decidual reaction. As findings might represent a very early intrauterine gestational sac, follow-up quantitative B-HCG levels and follow-up US in 14 days to confirm and assess viability is recommended. This recommendation follows SRU consensus guidelines: [employer] Med 2013; [id number]. Electronically Signed   By: Ashley Royalty M.D.   On: 09/22/2016 20:16   US Ob Transvaginal  Result Date: 09/13/2016 CLINICAL DATA:  Spotting yesterday, cramping now. Quantitative beta HCG 74. EXAM: OBSTETRIC <14 WK [place] AND TRANSVAGINAL OB US TECHNIQUE: Both transabdominal and transvaginal ultrasound examinations were performed for complete evaluation of the gestation as well as the maternal uterus, adnexal regions, and pelvic cul-de-sac. Transvaginal technique was performed to assess early pregnancy. COMPARISON:  None. FINDINGS: Intrauterine gestational sac: None seen. Maternal uterus/adnexae: Uterus appears normal. Endometrial complex appears homogeneous in thickness throughout. No mass or fluid identified within the endometrial canal. No free fluid in the  cul-de-sac. Both ovaries appear normal and there is no mass or free fluid identified in either adnexal region. IMPRESSION: Normal pelvic ultrasound. Uterus and ovaries appear normal. No intrauterine pregnancy demonstrated. No mass or free fluid identified in either adnexal region. Consider follow-up with serial beta HCG levels and additional pelvic ultrasound as needed. Electronically Signed   By: Roxy Horseman.D.  On: 09/13/2016 10:58     MAU Management/MDM: Consulted Dr Radene Knee who sent patient He authorized repeat US Repeat US confirms ectopic Methotrexate given DR Radene Knee will follow quants in office Monday   ASSESSMENT 1. Pelvic pain affecting pregnancy in first trimester, antepartum    2.     Ectopic pregnancy  PLAN Discharge home Methotrexate given Pt to return to [facility] office Monday Ectopic precautions    Pt stable at time of discharge. Encouraged to return here or to other Urgent Care/ED if she develops worsening of symptoms, increase in pain, fever, or other concerning symptoms.    Hansel Feinstein CNM, MSN Certified Nurse-Midwife 09/25/2016  1:45 PM

## 2016-09-25 NOTE — MAU Note (Signed)
Pt sent from MD office for MTX, pt very upset & crying.

## 2016-12-09 ENCOUNTER — Inpatient Hospital Stay (HOSPITAL_COMMUNITY)
Admission: AD | Admit: 2016-12-09 | Discharge: 2016-12-09 | Disposition: A | Discharge status: Home / Self Care | Payer: BC Managed Care – PPO | Source: Ambulatory Visit | Attending: Obstetrics and Gynecology | Admitting: Obstetrics and Gynecology

## 2016-12-09 ENCOUNTER — Encounter: Payer: Self-pay | Admitting: Student

## 2016-12-09 ENCOUNTER — Inpatient Hospital Stay (HOSPITAL_COMMUNITY): Payer: BC Managed Care – PPO

## 2016-12-09 DIAGNOSIS — O3680X0 Pregnancy with inconclusive fetal viability, not applicable or unspecified: Secondary | ICD-10-CM | POA: Diagnosis not present

## 2016-12-09 DIAGNOSIS — O209 Hemorrhage in early pregnancy, unspecified: Secondary | ICD-10-CM

## 2016-12-09 DIAGNOSIS — N83201 Unspecified ovarian cyst, right side: Secondary | ICD-10-CM | POA: Insufficient documentation

## 2016-12-09 DIAGNOSIS — Z87891 Personal history of nicotine dependence: Secondary | ICD-10-CM | POA: Insufficient documentation

## 2016-12-09 DIAGNOSIS — R109 Unspecified abdominal pain: Secondary | ICD-10-CM | POA: Insufficient documentation

## 2016-12-09 DIAGNOSIS — O0911 Supervision of pregnancy with history of ectopic or molar pregnancy, first trimester: Secondary | ICD-10-CM

## 2016-12-09 DIAGNOSIS — N83291 Other ovarian cyst, right side: Secondary | ICD-10-CM | POA: Diagnosis not present

## 2016-12-09 DIAGNOSIS — O26899 Other specified pregnancy related conditions, unspecified trimester: Secondary | ICD-10-CM

## 2016-12-09 DIAGNOSIS — O4690 Antepartum hemorrhage, unspecified, unspecified trimester: Secondary | ICD-10-CM

## 2016-12-09 LAB — URINALYSIS, ROUTINE W REFLEX MICROSCOPIC
Bilirubin Urine: NEGATIVE
Glucose, UA: NEGATIVE mg/dL
Ketones, ur: NEGATIVE mg/dL
LEUKOCYTES UA: NEGATIVE
Nitrite: NEGATIVE
PH: 7 (ref 5.0–8.0)
Protein, ur: NEGATIVE mg/dL
SPECIFIC GRAVITY, URINE: 1.019 (ref 1.005–1.030)

## 2016-12-09 LAB — CBC
HEMATOCRIT: 39 % (ref 36.0–46.0)
HEMOGLOBIN: 12.7 g/dL (ref 12.0–15.0)
MCH: 26.2 pg (ref 26.0–34.0)
MCHC: 32.6 g/dL (ref 30.0–36.0)
MCV: 80.4 fL (ref 78.0–100.0)
Platelets: 256 10*3/uL (ref 150–400)
RBC: 4.85 MIL/uL (ref 3.87–5.11)
RDW: 13.8 % (ref 11.5–15.5)
WBC: 7.1 10*3/uL (ref 4.0–10.5)

## 2016-12-09 LAB — POCT PREGNANCY, URINE: Preg Test, Ur: NEGATIVE

## 2016-12-09 LAB — HCG, QUANTITATIVE, PREGNANCY: hCG, Beta Chain, Quant, S: 18 m[IU]/mL — ABNORMAL HIGH (ref <5)

## 2016-12-09 NOTE — MAU Note (Signed)
PT  SAYS SHE HAD AN ECTOPIC  IN DEC-   WITH METHOTREXATE   -   THEN LMP  WAS 11-06-2016         THEN  WAS FEELING  SHARP- PAIN IN HER  SIDE   ON 2-12-  WENT   TO DR  MCCOMB   -      QUANT   WAS 18 ON 2-14   .     THEN TOLD  TO COME  BACK AND  F OLLOW- UP   ON 2-28.    BUT  TODAY  SHE  FEELS  SHARP PAIN    IN MID   ABD    AND  SPOTTING  BROWN   WHEN  SHE WIPES    NO BIRTH  CONTROL.   LAST SEX  - 2-1

## 2016-12-09 NOTE — MAU Provider Note (Signed)
History     CSN: [id number]  Arrival date and time: 12/09/16 [id number]   First Provider Initiated Contact with Patient 12/09/16 2151      Chief Complaint  Patient presents with  . Abdominal Pain   HPI  Erika Osborne is a 31 y.o. G58P0010 female who presents for abdominal pain. Patient was treated for ectopic pregnancy with MTX in December. Followed BHCG down to 186 on 10/29/16 but missed subsequent follow up due to winter weather. Had unprotected intercourse the beginning of this month. Went to office last week for abdominal pain; bhcg that day was 18 but was told it was unclear if that was remaining from ectopic pregnancy or a new pregnancy. Was told to f/u in the office in 2 weeks.  Comes to MAU tonight with complaint of continued abdominal pain & new onset of brown spotting. Abdominal pain is intermittent sharp pain in RLQ & right side. Rates pain 6/10. Has been taking ibuprofen with mild relief. Brown spotting noted on toilet paper today. Denies n/v/d, constipation, vaginal discharge, fever, or dysuria.   Past Medical History:  Diagnosis Date  . Anxiety   . 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 Colitis   . Endometrial polyp   . History of stomach ulcers 2010  . Menometrorrhagia 12/14/2008  . Migraines    "I take RX daily; still have one once/wk" (10/05/2015)  . STD (sexually transmitted disease) 2010   treatment for chlamydia    Past Surgical History:  Procedure Laterality Date  . COLPOSCOPY    . COMBINED HYSTEROSCOPY DIAGNOSTIC / D&C  2013  . DILATATION & CURETTAGE/HYSTEROSCOPY WITH TRUECLEAR N/A 06/09/2013   Procedure: DILATATION & CURETTAGE/HYSTEROSCOPY WITH TRUECLEAR;  Surgeon: Terrance Mass, MD;  Location: [facility];  Service: Gynecology;  Laterality: N/A;  INSERVICE BY JASON    Family History  Problem Relation Age of Onset  . Diabetes Father   . Hypertension Father   . Diabetes Paternal Grandfather   . Heart disease Paternal Grandfather   .  Stroke Maternal Grandfather   . Hypertension Paternal Grandmother     Social History  Substance Use Topics  . Smoking status: Former Smoker    Years: 3.00    Types: Cigars    Quit date: 12/15/2011  . Smokeless tobacco: Never Used  . Alcohol use No    Allergies: No Known Allergies  Prescriptions Prior to Admission  Medication Sig Dispense Refill Last Dose  . albuterol (PROVENTIL HFA;VENTOLIN HFA) 108 (90 BASE) MCG/ACT inhaler Inhale 2 puffs into the lungs every 6 (six) hours as needed. For wheezing   rescue    Review of Systems  Constitutional: Negative.   Gastrointestinal: Positive for abdominal pain. Negative for constipation, diarrhea, nausea and vomiting.  Genitourinary: Positive for vaginal bleeding. Negative for dysuria.   Physical Exam   Blood pressure 119/80, pulse 71, temperature 98.8 F (37.1 C), temperature source Oral, resp. rate 20, height 5' 2.5" (1.588 m), weight 184 lb (83.5 kg), last menstrual period 11/06/2016, unknown if currently breastfeeding.  Physical Exam  Nursing note and vitals reviewed. Constitutional: She is oriented to person, place, and time. She appears well-developed and well-nourished. No distress.  HENT:  Head: Normocephalic and atraumatic.  Eyes: Conjunctivae are normal. Right eye exhibits no discharge. Left eye exhibits no discharge. No scleral icterus.  Neck: Normal range of motion.  Cardiovascular: Normal rate, regular rhythm and normal heart sounds.   No murmur heard. Respiratory: Effort normal and breath sounds normal. No respiratory  distress. She has no wheezes.  GI: Soft. Bowel sounds are normal. She exhibits no distension. There is no tenderness. There is no rebound and no guarding.  Neurological: She is alert and oriented to person, place, and time.  Skin: Skin is warm and dry. She is not diaphoretic.  Psychiatric: She has a normal mood and affect. Her behavior is normal. Judgment and thought content normal.    MAU Course   Procedures Results for orders placed or performed during the hospital encounter of 12/09/16 (from the past 24 hour(s))  Urinalysis, Routine w reflex microscopic (not at [facility])     Status: Abnormal   Collection Time: 12/09/16  8:33 PM  Result Value Ref Range   Color, Urine YELLOW YELLOW   APPearance CLEAR CLEAR   Specific Gravity, Urine 1.019 1.005 - 1.030   pH 7.0 5.0 - 8.0   Glucose, UA NEGATIVE NEGATIVE mg/dL   Hgb urine dipstick LARGE (A) NEGATIVE   Bilirubin Urine NEGATIVE NEGATIVE   Ketones, ur NEGATIVE NEGATIVE mg/dL   Protein, ur NEGATIVE NEGATIVE mg/dL   Nitrite NEGATIVE NEGATIVE   Leukocytes, UA NEGATIVE NEGATIVE   RBC / HPF 0-5 0 - 5 RBC/hpf   WBC, UA 0-5 0 - 5 WBC/hpf   Bacteria, UA RARE (A) NONE SEEN   Squamous Epithelial / LPF 0-5 (A) NONE SEEN   Mucous PRESENT   Pregnancy, urine POC     Status: None   Collection Time: 12/09/16  8:41 PM  Result Value Ref Range   Preg Test, Ur NEGATIVE NEGATIVE  CBC     Status: None   Collection Time: 12/09/16  8:50 PM  Result Value Ref Range   WBC 7.1 4.0 - 10.5 K/uL   RBC 4.85 3.87 - 5.11 MIL/uL   Hemoglobin 12.7 12.0 - 15.0 g/dL   HCT 39.0 36.0 - 46.0 %   MCV 80.4 78.0 - 100.0 fL   MCH 26.2 26.0 - 34.0 pg   MCHC 32.6 30.0 - 36.0 g/dL   RDW 13.8 11.5 - 15.5 %   Platelets 256 150 - 400 K/uL  hCG, quantitative, pregnancy     Status: Abnormal   Collection Time: 12/09/16  8:50 PM  Result Value Ref Range   hCG, Beta Chain, Quant, S 18 (H) <5 mIU/mL   US Ob Comp Less 14 Wks  Result Date: 12/09/2016 CLINICAL DATA:  Mid abdominal pain with vaginal spotting. History of treated right ectopic December 2017 EXAM: OBSTETRIC <14 WK [place] AND TRANSVAGINAL OB US TECHNIQUE: Both transabdominal and transvaginal ultrasound examinations were performed for complete evaluation of the gestation as well as the maternal uterus, adnexal regions, and pelvic cul-de-sac. Transvaginal technique was performed to assess early pregnancy. COMPARISON:  None.  FINDINGS: Intrauterine gestational sac: Not visualized Yolk sac:  Not visualized Embryo:  Not visualized Maternal uterus/adnexae: Left ovary is within normal limits and measures 2.6 by 3.3 x 2 cm. The right ovary measures 6.1 x 4.3 by 5.5 cm. Complex cyst right ovary measuring 5.3 x 3.5 x 5 cm, this contains internal septations. Small amount of free fluid. IMPRESSION: 1. No intrauterine gestational sac, yolk sac or fetal pole is visualized. Correlation with serial quantitative beta HCG and follow-up ultrasound as clinically indicated. 2. 5.3 cm complex cyst right ovary (?hemorrhagic). Recommend 6-12 week sonographic follow-up. 3. Small amount of free fluid in the pelvis Electronically Signed   By: Donavan Foil M.D.   On: 12/09/2016 22:58   US Ob Transvaginal  Result Date: 12/09/2016 CLINICAL  DATA:  Mid abdominal pain with vaginal spotting. History of treated right ectopic December 2017 EXAM: OBSTETRIC <14 WK [place] AND TRANSVAGINAL OB US TECHNIQUE: Both transabdominal and transvaginal ultrasound examinations were performed for complete evaluation of the gestation as well as the maternal uterus, adnexal regions, and pelvic cul-de-sac. Transvaginal technique was performed to assess early pregnancy. COMPARISON:  None. FINDINGS: Intrauterine gestational sac: Not visualized Yolk sac:  Not visualized Embryo:  Not visualized Maternal uterus/adnexae: Left ovary is within normal limits and measures 2.6 by 3.3 x 2 cm. The right ovary measures 6.1 x 4.3 by 5.5 cm. Complex cyst right ovary measuring 5.3 x 3.5 x 5 cm, this contains internal septations. Small amount of free fluid. IMPRESSION: 1. No intrauterine gestational sac, yolk sac or fetal pole is visualized. Correlation with serial quantitative beta HCG and follow-up ultrasound as clinically indicated. 2. 5.3 cm complex cyst right ovary (?hemorrhagic). Recommend 6-12 week sonographic follow-up. 3. Small amount of free fluid in the pelvis Electronically Signed   By: Donavan Foil M.D.   On: 12/09/2016 22:58    MDM O positive UPT negative BHCG today 18 Ultrasound shows no IUP; complex right ovarian cyst measuring 5.3 cm; small amt of free fluid S/w Dr. Julien Girt. Discussed BHCG & ultrasound. Ok to discharge home. Will have pt f/u in office tomorrow.   Assessment and Plan  A; 1. Pregnancy of unknown anatomic location   2. Vaginal bleeding in pregnancy, first trimester   3. Pregnancy in first trimester with history of ectopic pregnancy   4. Abdominal pain affecting pregnancy   5. Right ovarian cyst    P: Discharge home Discussed reasons to return to MAU; including s/s of ectopic pregnancy or ovarian torsion Pelvic rest Call office in morning for follow up appt tomorrow   Jorje Guild 12/09/2016, 9:51 PM

## 2016-12-09 NOTE — Discharge Instructions (Signed)
Abdominal Pain During Pregnancy Belly (abdominal) pain is common during pregnancy. Most of the time, it is not a serious problem. Other times, it can be a sign that something is wrong with the pregnancy. Always tell your doctor if you have belly pain. Follow these instructions at home: Monitor your belly pain for any changes. The following actions may help you feel better:  Do not have sex (intercourse) or put anything in your vagina until you feel better.  Rest until your pain stops.  Drink clear fluids if you feel sick to your stomach (nauseous). Do not eat solid food until you feel better.  Only take medicine as told by your doctor.  Keep all doctor visits as told. Get help right away if:  You are bleeding, leaking fluid, or pieces of tissue come out of your vagina.  You have more pain or cramping.  You keep throwing up (vomiting).  You have pain when you pee (urinate) or have blood in your pee.  You have a fever.  You do not feel your baby moving as much.  You feel very weak or feel like passing out.  You have trouble breathing, with or without belly pain.  You have a very bad headache and belly pain.  You have fluid leaking from your vagina and belly pain.  You keep having watery poop (diarrhea).  Your belly pain does not go away after resting, or the pain gets worse. This information is not intended to replace advice given to you by your health care provider. Make sure you discuss any questions you have with your health care provider. Document Released: 09/24/2009 Document Revised: 05/14/2016 Document Reviewed: 05/05/2013 Elsevier Interactive Patient Education  2017 [employer]. Ovarian Cyst  An ovarian cyst is a fluid-filled sac that forms on an ovary. The ovaries are small organs that produce eggs in women. Various types of cysts can form on the ovaries. Some may cause symptoms and require treatment. Most ovarian cysts go away on their own, are not cancerous (are  benign), and do not cause problems. Common types of ovarian cysts include:  Functional (follicle) cysts.  Occur during the menstrual cycle, and usually go away with the next menstrual cycle if you do not get pregnant.  Usually cause no symptoms.  Endometriomas.  Are cysts that form from the tissue that lines the uterus (endometrium).  Are sometimes called chocolate cysts because they become filled with blood that turns brown.  Can cause pain in the lower abdomen during intercourse and during your period.  Cystadenoma cysts.  Develop from cells on the outside surface of the ovary.  Can get very large and cause lower abdomen pain and pain with intercourse.  Can cause severe pain if they twist or break open (rupture).  Dermoid cysts.  Are sometimes found in both ovaries.  May contain different kinds of body tissue, such as skin, teeth, hair, or cartilage.  Usually do not cause symptoms unless they get very big.  Theca lutein cysts.  Occur when too much of a certain hormone (human chorionic gonadotropin) is produced and overstimulates the ovaries to produce an egg.  Are most common after having procedures used to assist with the conception of a baby (in vitro fertilization). What are the causes? Ovarian cysts may be caused by:  Ovarian hyperstimulation syndrome. This is a condition that can develop from taking fertility medicines. It causes multiple large ovarian cysts to form.  Polycystic ovarian syndrome (PCOS). This is a common hormonal disorder that  can cause ovarian cysts, as well as problems with your period or fertility. What increases the risk? The following factors may make you more likely to develop ovarian cysts:  Being overweight or obese.  Taking fertility medicines.  Taking certain forms of hormonal birth control.  Smoking. What are the signs or symptoms? Many ovarian cysts do not cause symptoms. If symptoms are present, they may include:  Pelvic  pain or pressure.  Pain in the lower abdomen.  Pain during sex.  Abdominal swelling.  Abnormal menstrual periods.  Increasing pain with menstrual periods. How is this diagnosed? These cysts are commonly found during a routine pelvic exam. You may have tests to find out more about the cyst, such as:  Ultrasound.  X-ray of the pelvis.  CT scan.  MRI.  Blood tests. How is this treated? Many ovarian cysts go away on their own without treatment. Your health care provider may want to check your cyst regularly for 2-3 months to see if it changes. If you are in menopause, it is especially important to have your cyst monitored closely because menopausal women have a higher rate of ovarian cancer. When treatment is needed, it may include:  Medicines to help relieve pain.  A procedure to drain the cyst (aspiration).  Surgery to remove the whole cyst.  Hormone treatment or birth control pills. These methods are sometimes used to help dissolve a cyst. Follow these instructions at home:  Take over-the-counter and prescription medicines only as told by your health care provider.  Do not drive or use heavy machinery while taking prescription pain medicine.  Get regular pelvic exams and Pap tests as often as told by your health care provider.  Return to your normal activities as told by your health care provider. Ask your health care provider what activities are safe for you.  Do not use any products that contain nicotine or tobacco, such as cigarettes and e-cigarettes. If you need help quitting, ask your health care provider.  Keep all follow-up visits as told by your health care provider. This is important. Contact a health care provider if:  Your periods are late, irregular, or painful, or they stop.  You have pelvic pain that does not go away.  You have pressure on your bladder or trouble emptying your bladder completely.  You have pain during sex.  You have any of the  following in your abdomen:  A feeling of fullness.  Pressure.  Discomfort.  Pain that does not go away.  Swelling.  You feel generally ill.  You become constipated.  You lose your appetite.  You develop severe acne.  You start to have more body hair and facial hair.  You are gaining weight or losing weight without changing your exercise and eating habits.  You think you may be pregnant. Get help right away if:  You have abdominal pain that is severe or gets worse.  You cannot eat or drink without vomiting.  You suddenly develop a fever.  Your menstrual period is much heavier than usual. This information is not intended to replace advice given to you by your health care provider. Make sure you discuss any questions you have with your health care provider. Document Released: 10/06/2005 Document Revised: 04/25/2016 Document Reviewed: 03/09/2016 Elsevier Interactive Patient Education  2017 [employer].

## 2016-12-14 ENCOUNTER — Inpatient Hospital Stay (HOSPITAL_COMMUNITY)
Admission: AD | Admit: 2016-12-14 | Discharge: 2016-12-14 | Disposition: A | Discharge status: Home / Self Care | Payer: BC Managed Care – PPO | Source: Ambulatory Visit | Attending: Obstetrics and Gynecology | Admitting: Obstetrics and Gynecology

## 2016-12-14 ENCOUNTER — Encounter (HOSPITAL_COMMUNITY): Payer: Self-pay | Admitting: *Deleted

## 2016-12-14 DIAGNOSIS — O0281 Inappropriate change in quantitative human chorionic gonadotropin (hCG) in early pregnancy: Secondary | ICD-10-CM | POA: Insufficient documentation

## 2016-12-14 DIAGNOSIS — Z3202 Encounter for pregnancy test, result negative: Secondary | ICD-10-CM | POA: Diagnosis not present

## 2016-12-14 DIAGNOSIS — N83201 Unspecified ovarian cyst, right side: Secondary | ICD-10-CM | POA: Diagnosis not present

## 2016-12-14 DIAGNOSIS — R109 Unspecified abdominal pain: Secondary | ICD-10-CM | POA: Diagnosis present

## 2016-12-14 LAB — CBC WITH DIFFERENTIAL/PLATELET
Basophils Absolute: 0.1 10*3/uL (ref 0.0–0.1)
Basophils Relative: 1 %
Eosinophils Absolute: 0.1 10*3/uL (ref 0.0–0.7)
Eosinophils Relative: 1 %
HEMATOCRIT: 38.4 % (ref 36.0–46.0)
Hemoglobin: 12.4 g/dL (ref 12.0–15.0)
LYMPHS PCT: 45 %
Lymphs Abs: 2.3 10*3/uL (ref 0.7–4.0)
MCH: 26 pg (ref 26.0–34.0)
MCHC: 32.3 g/dL (ref 30.0–36.0)
MCV: 80.5 fL (ref 78.0–100.0)
MONO ABS: 0.1 10*3/uL (ref 0.1–1.0)
MONOS PCT: 2 %
NEUTROS ABS: 2.5 10*3/uL (ref 1.7–7.7)
Neutrophils Relative %: 51 %
Platelets: 254 10*3/uL (ref 150–400)
RBC: 4.77 MIL/uL (ref 3.87–5.11)
RDW: 14 % (ref 11.5–15.5)
WBC: 5 10*3/uL (ref 4.0–10.5)

## 2016-12-14 LAB — URINALYSIS, ROUTINE W REFLEX MICROSCOPIC
BILIRUBIN URINE: NEGATIVE
Glucose, UA: NEGATIVE mg/dL
KETONES UR: NEGATIVE mg/dL
LEUKOCYTES UA: NEGATIVE
NITRITE: NEGATIVE
Protein, ur: NEGATIVE mg/dL
SPECIFIC GRAVITY, URINE: 1.011 (ref 1.005–1.030)
pH: 8 (ref 5.0–8.0)

## 2016-12-14 LAB — POCT PREGNANCY, URINE: Preg Test, Ur: NEGATIVE

## 2016-12-14 LAB — HCG, QUANTITATIVE, PREGNANCY: hCG, Beta Chain, Quant, S: 14 m[IU]/mL — ABNORMAL HIGH (ref <5)

## 2016-12-14 MED ORDER — KETOROLAC TROMETHAMINE 10 MG PO TABS: 10.0000 mg | ORAL_TABLET | Freq: Four times a day (QID) | ORAL | 0 refills | Status: AC | PRN

## 2016-12-14 MED ORDER — KETOROLAC TROMETHAMINE 60 MG/2ML IM SOLN
60.0000 mg | Freq: Once | INTRAMUSCULAR | Status: AC
  Administered 2016-12-14: 60 mg via INTRAMUSCULAR
  Filled 2016-12-14: qty 2

## 2016-12-14 NOTE — Discharge Instructions (Signed)
Your pregnancy hormone level decreased from 18 to 14 suggesting a failed pregnancy. It is uncertain whether this hormone is still elevated from your ectopic pregnancy in December or represents a miscarrying new pregnancy.  Ovarian Cyst  An ovarian cyst is a fluid-filled sac that forms on an ovary. The ovaries are small organs that produce eggs in women. Various types of cysts can form on the ovaries. Some may cause symptoms and require treatment. Most ovarian cysts go away on their own, are not cancerous (are benign), and do not cause problems. Common types of ovarian cysts include:  Functional (follicle) cysts.  Occur during the menstrual cycle, and usually go away with the next menstrual cycle if you do not get pregnant.  Usually cause no symptoms.  Endometriomas.  Are cysts that form from the tissue that lines the uterus (endometrium).  Are sometimes called chocolate cysts because they become filled with blood that turns brown.  Can cause pain in the lower abdomen during intercourse and during your period.  Cystadenoma cysts.  Develop from cells on the outside surface of the ovary.  Can get very large and cause lower abdomen pain and pain with intercourse.  Can cause severe pain if they twist or break open (rupture).  Dermoid cysts.  Are sometimes found in both ovaries.  May contain different kinds of body tissue, such as skin, teeth, hair, or cartilage.  Usually do not cause symptoms unless they get very big.  Theca lutein cysts.  Occur when too much of a certain hormone (human chorionic gonadotropin) is produced and overstimulates the ovaries to produce an egg.  Are most common after having procedures used to assist with the conception of a baby (in vitro fertilization). What are the causes? Ovarian cysts may be caused by:  Ovarian hyperstimulation syndrome. This is a condition that can develop from taking fertility medicines. It causes multiple large ovarian cysts  to form.  Polycystic ovarian syndrome (PCOS). This is a common hormonal disorder that can cause ovarian cysts, as well as problems with your period or fertility. What increases the risk? The following factors may make you more likely to develop ovarian cysts:  Being overweight or obese.  Taking fertility medicines.  Taking certain forms of hormonal birth control.  Smoking. What are the signs or symptoms? Many ovarian cysts do not cause symptoms. If symptoms are present, they may include:  Pelvic pain or pressure.  Pain in the lower abdomen.  Pain during sex.  Abdominal swelling.  Abnormal menstrual periods.  Increasing pain with menstrual periods. How is this diagnosed? These cysts are commonly found during a routine pelvic exam. You may have tests to find out more about the cyst, such as:  Ultrasound.  X-ray of the pelvis.  CT scan.  MRI.  Blood tests. How is this treated? Many ovarian cysts go away on their own without treatment. Your health care provider may want to check your cyst regularly for 2-3 months to see if it changes. If you are in menopause, it is especially important to have your cyst monitored closely because menopausal women have a higher rate of ovarian cancer. When treatment is needed, it may include:  Medicines to help relieve pain.  A procedure to drain the cyst (aspiration).  Surgery to remove the whole cyst.  Hormone treatment or birth control pills. These methods are sometimes used to help dissolve a cyst. Follow these instructions at home:  Take over-the-counter and prescription medicines only as told by your health care  provider.  Do not drive or use heavy machinery while taking prescription pain medicine.  Get regular pelvic exams and Pap tests as often as told by your health care provider.  Return to your normal activities as told by your health care provider. Ask your health care provider what activities are safe for you.  Do  not use any products that contain nicotine or tobacco, such as cigarettes and e-cigarettes. If you need help quitting, ask your health care provider.  Keep all follow-up visits as told by your health care provider. This is important. Contact a health care provider if:  Your periods are late, irregular, or painful, or they stop.  You have pelvic pain that does not go away.  You have pressure on your bladder or trouble emptying your bladder completely.  You have pain during sex.  You have any of the following in your abdomen:  A feeling of fullness.  Pressure.  Discomfort.  Pain that does not go away.  Swelling.  You feel generally ill.  You become constipated.  You lose your appetite.  You develop severe acne.  You start to have more body hair and facial hair.  You are gaining weight or losing weight without changing your exercise and eating habits.  You think you may be pregnant. Get help right away if:  You have abdominal pain that is severe or gets worse.  You cannot eat or drink without vomiting.  You suddenly develop a fever.  Your menstrual period is much heavier than usual. This information is not intended to replace advice given to you by your health care provider. Make sure you discuss any questions you have with your health care provider. Document Released: 10/06/2005 Document Revised: 04/25/2016 Document Reviewed: 03/09/2016 Elsevier Interactive Patient Education  2017 [employer].

## 2016-12-14 NOTE — MAU Provider Note (Signed)
History    First Provider Initiated Sport and exercise psychologist with Patient 12/14/16 [phone]      Chief Complaint:  Abdominal Pain and Vaginal Bleeding   Erika Osborne is  31 y.o. G1P0010 Patient's last menstrual period was 11/06/2016.Marland Kitchen Patient is here for follow up of quantitative HCG. She is 2 months S/P MTX Tx for ectopic pregnancy. Did not follow hCG's all the down at office due to scheduling issues and weather closures. Reports that it was down to ~180 around the end of January. Returned to [facility] due to abd pain on 2/14. Quant was 18. Uncertain if it was still elevated from previous ectopic or new pregnancy.   Pt came to MAU 2/20 for worsening mid abd pain and spotting. Quant was 60. US showed nothing in the uterus and a 5.3 cm right hemorrhagic cyst.   Since her last visit, the patient is without new complaint.     ROS Abdomin Pain: Present, moderate Vaginal bleeding: spotting.   Passage of clots or tissue: None Dizziness: None  Her previous Quantitative HCG values are: 2/14: 18 2.20: 18  Physical Exam   BP 117/66 (BP Location: Left Arm)   Pulse 84   Temp 98.4 F (36.9 C) (Oral)   Resp 18   Ht 5' 2.5" (1.588 m)   Wt 186 lb (84.4 kg)   LMP 11/06/2016   BMI 33.48 kg/m  Constitutional: Well-nourished female in no apparent distress. No pallor Neuro: Alert and oriented 4 Cardiovascular: Normal rate Respiratory: Normal effort and rate Abdomen: Soft, nontender Gynecological Exam: examination not indicated  Labs: Results for orders placed or performed during the hospital encounter of 12/14/16 (from the past 24 hour(s))  Urinalysis, Routine w reflex microscopic   Collection Time: 12/14/16  7:32 AM  Result Value Ref Range   Color, Urine YELLOW YELLOW   APPearance CLEAR CLEAR   Specific Gravity, Urine 1.011 1.005 - 1.030   pH 8.0 5.0 - 8.0   Glucose, UA NEGATIVE NEGATIVE mg/dL   Hgb urine dipstick MODERATE (A) NEGATIVE   Bilirubin Urine NEGATIVE NEGATIVE   Ketones, ur  NEGATIVE NEGATIVE mg/dL   Protein, ur NEGATIVE NEGATIVE mg/dL   Nitrite NEGATIVE NEGATIVE   Leukocytes, UA NEGATIVE NEGATIVE   RBC / HPF 0-5 0 - 5 RBC/hpf   WBC, UA 0-5 0 - 5 WBC/hpf   Bacteria, UA RARE (A) NONE SEEN   Squamous Epithelial / LPF 0-5 (A) NONE SEEN  Pregnancy, urine POC   Collection Time: 12/14/16  7:46 AM  Result Value Ref Range   Preg Test, Ur NEGATIVE NEGATIVE  hCG, quantitative, pregnancy   Collection Time: 12/14/16  8:39 AM  Result Value Ref Range   hCG, Beta Chain, Quant, S 14 (H) <5 mIU/mL  CBC with Differential/Platelet   Collection Time: 12/14/16  8:39 AM  Result Value Ref Range   WBC 5.0 4.0 - 10.5 K/uL   RBC 4.77 3.87 - 5.11 MIL/uL   Hemoglobin 12.4 12.0 - 15.0 g/dL   HCT 38.4 36.0 - 46.0 %   MCV 80.5 78.0 - 100.0 fL   MCH 26.0 26.0 - 34.0 pg   MCHC 32.3 30.0 - 36.0 g/dL   RDW 14.0 11.5 - 15.5 %   Platelets 254 150 - 400 K/uL   Neutrophils Relative % 51 %   Neutro Abs 2.5 1.7 - 7.7 K/uL   Lymphocytes Relative 45 %   Lymphs Abs 2.3 0.7 - 4.0 K/uL   Monocytes Relative 2 %   Monocytes Absolute  0.1 0.1 - 1.0 K/uL   Eosinophils Relative 1 %   Eosinophils Absolute 0.1 0.0 - 0.7 K/uL   Basophils Relative 1 %   Basophils Absolute 0.1 0.0 - 0.1 K/uL    Ultrasound Studies:   US Ob Comp Less 14 Wks  Result Date: 12/09/2016 CLINICAL DATA:  Mid abdominal pain with vaginal spotting. History of treated right ectopic December 2017 EXAM: OBSTETRIC <14 WK [place] AND TRANSVAGINAL OB US TECHNIQUE: Both transabdominal and transvaginal ultrasound examinations were performed for complete evaluation of the gestation as well as the maternal uterus, adnexal regions, and pelvic cul-de-sac. Transvaginal technique was performed to assess early pregnancy. COMPARISON:  None. FINDINGS: Intrauterine gestational sac: Not visualized Yolk sac:  Not visualized Embryo:  Not visualized Maternal uterus/adnexae: Left ovary is within normal limits and measures 2.6 by 3.3 x 2 cm. The right ovary  measures 6.1 x 4.3 by 5.5 cm. Complex cyst right ovary measuring 5.3 x 3.5 x 5 cm, this contains internal septations. Small amount of free fluid. IMPRESSION: 1. No intrauterine gestational sac, yolk sac or fetal pole is visualized. Correlation with serial quantitative beta HCG and follow-up ultrasound as clinically indicated. 2. 5.3 cm complex cyst right ovary (?hemorrhagic). Recommend 6-12 week sonographic follow-up. 3. Small amount of free fluid in the pelvis Electronically Signed   By: Donavan Foil M.D.   On: 12/09/2016 22:58   US Ob Transvaginal  Result Date: 12/09/2016 CLINICAL DATA:  Mid abdominal pain with vaginal spotting. History of treated right ectopic December 2017 EXAM: OBSTETRIC <14 WK [place] AND TRANSVAGINAL OB US TECHNIQUE: Both transabdominal and transvaginal ultrasound examinations were performed for complete evaluation of the gestation as well as the maternal uterus, adnexal regions, and pelvic cul-de-sac. Transvaginal technique was performed to assess early pregnancy. COMPARISON:  None. FINDINGS: Intrauterine gestational sac: Not visualized Yolk sac:  Not visualized Embryo:  Not visualized Maternal uterus/adnexae: Left ovary is within normal limits and measures 2.6 by 3.3 x 2 cm. The right ovary measures 6.1 x 4.3 by 5.5 cm. Complex cyst right ovary measuring 5.3 x 3.5 x 5 cm, this contains internal septations. Small amount of free fluid. IMPRESSION: 1. No intrauterine gestational sac, yolk sac or fetal pole is visualized. Correlation with serial quantitative beta HCG and follow-up ultrasound as clinically indicated. 2. 5.3 cm complex cyst right ovary (?hemorrhagic). Recommend 6-12 week sonographic follow-up. 3. Small amount of free fluid in the pelvis Electronically Signed   By: Donavan Foil M.D.   On: 12/09/2016 22:58    MAU course/MDM: Quantitative hCG ordered  Pain and bleeding w/ dropping, very low Quant. Cannot be certain if this is from previous ectopic or new pregnancy, but it is  dropping. Discussed Hx, labs, exam w/ Dr. Louellen Molder w/ POC. New orders: None.   Assessment: Dropping quant-->. 1. Inappropriate change in quantitative hCG in early pregnancy   2. Cyst of right ovary    Plan: Discharge home in stable condition per consult w/ Dr Delmar Landau. Abd pain, torsion precautions Will need F/U in 6-8 weeks to re-eval cyst Keep scheduled F/U appt 2/28 Rx Toradol for pain.  Follow-up Information    [facility] Follow up on 12/17/2016.   Why:  As scheduled or sooner as needed if symptoms worsen Contact information: [place] [address] [phone]        [facility] Follow up.   Why:  As needed in emergencies Contact information:  [address] [id number] [place] [phone]         Allergies as of 12/14/2016   No Known Allergies     Medication List    TAKE these medications   ketorolac 10 MG tablet Commonly known as:  TORADOL Take 1 tablet (10 mg total) by mouth every 6 (six) hours as needed.   topiramate 200 MG tablet Commonly known as:  TOPAMAX Take 200 mg by mouth daily.       Manya Silvas, CNM 12/14/2016, 10:47 AM  2/3

## 2016-12-14 NOTE — MAU Note (Signed)
Pt had ectopic in December, received MTX, was seen in MAU on the Feb 20 for lower abd pain, BHCG was 18, was unsure if this was because of ectopic or a new pregnancy.  Started having constant RLQ pain yesterday, has been spotting for 3 days, very dark red.

## 2017-04-07 ENCOUNTER — Other Ambulatory Visit: Payer: Self-pay | Admitting: Obstetrics and Gynecology

## 2017-10-19 IMAGING — US US OB TRANSVAGINAL
1 series · 15 of 28 positions shown · non-contrast
Comparison: 09/13/2016

CLINICAL DATA: Left lower quadrant pain, beta HCG is pending at the
time of the study and dictation

EXAM:
TRANSVAGINAL OB ULTRASOUND
TECHNIQUE: Transvaginal ultrasound was performed for complete evaluation of the
gestation as well as the maternal uterus, adnexal regions, and
pelvic cul-de-sac.

[Series 1: us ob transvaginal · 15 of 35 slices shown]
[im 1/35]
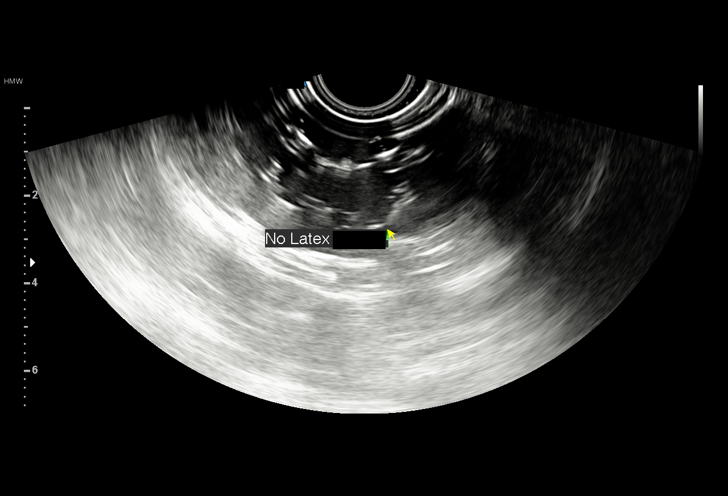
[im 3/35]
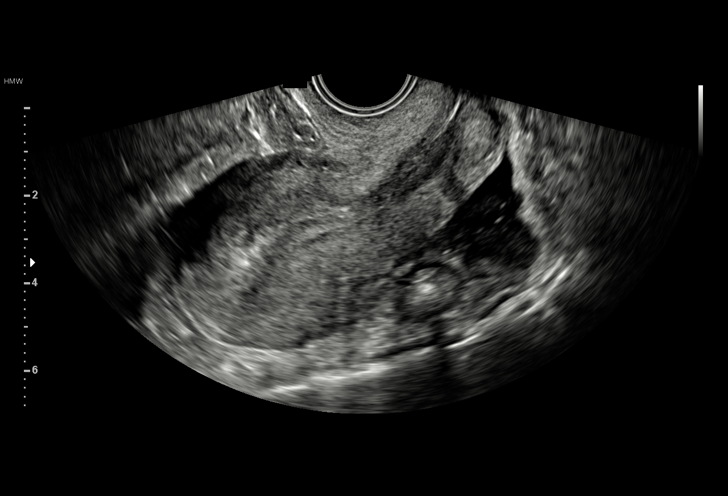
[im 6/35]
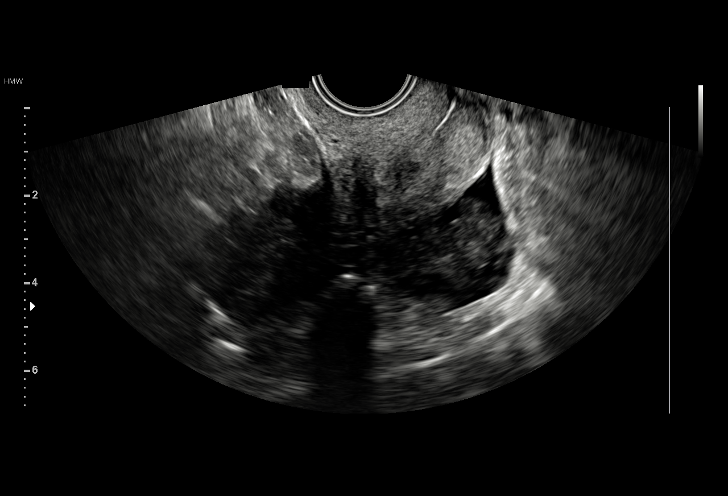
[im 8/35]
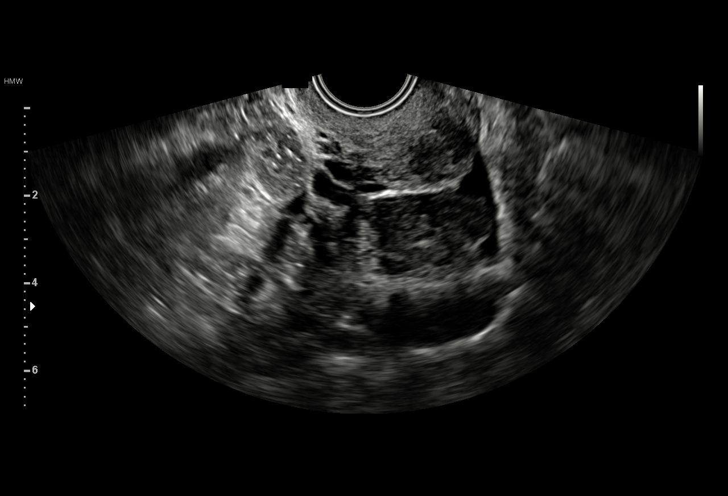
[im 11/35]
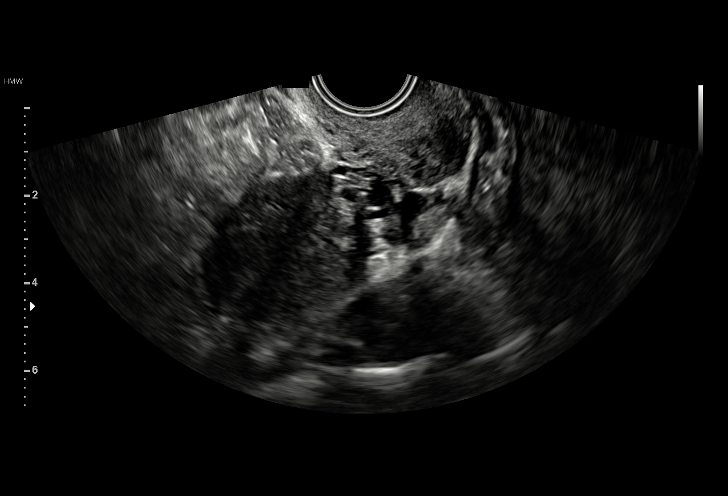
[im 13/35]
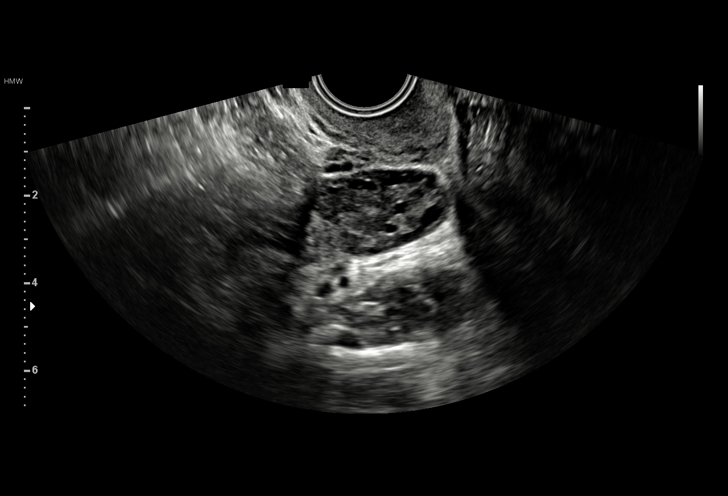
[im 16/35]
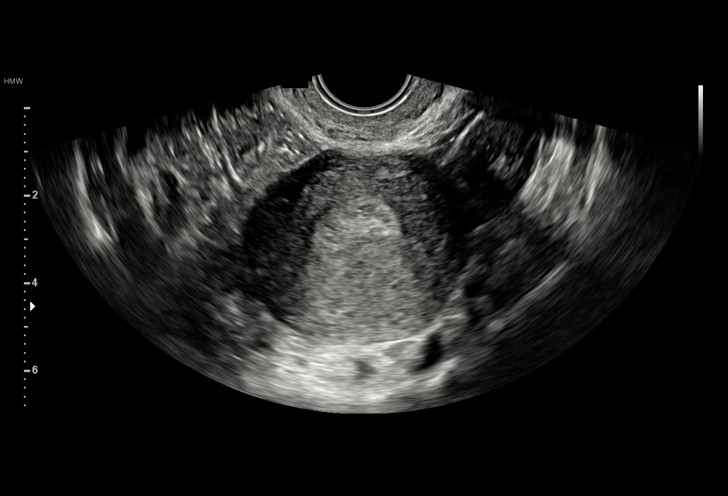
[im 18/35]
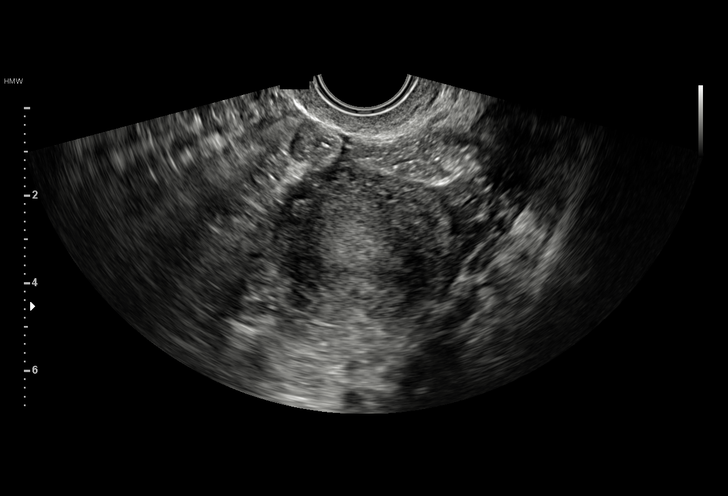
[im 19/35]
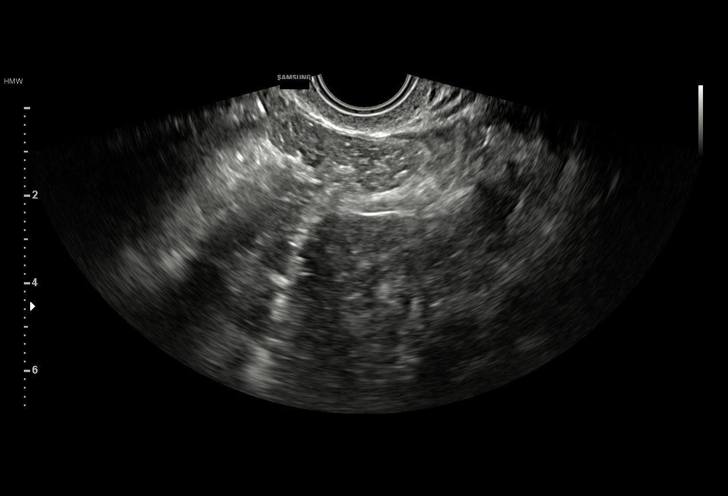
[im 22/35]
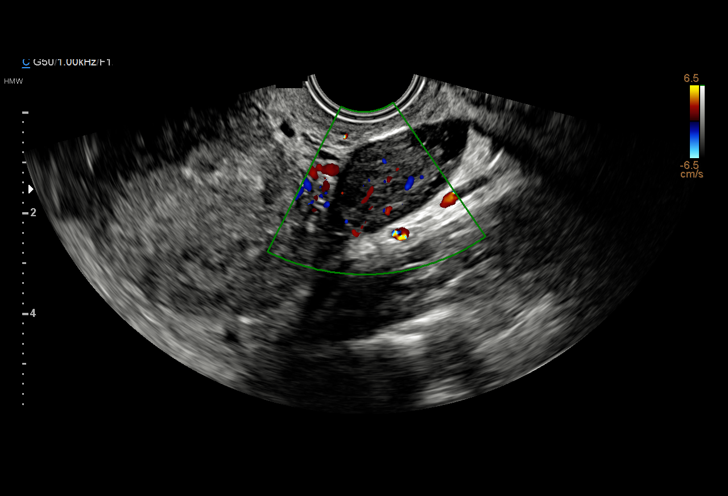
[im 24/35]
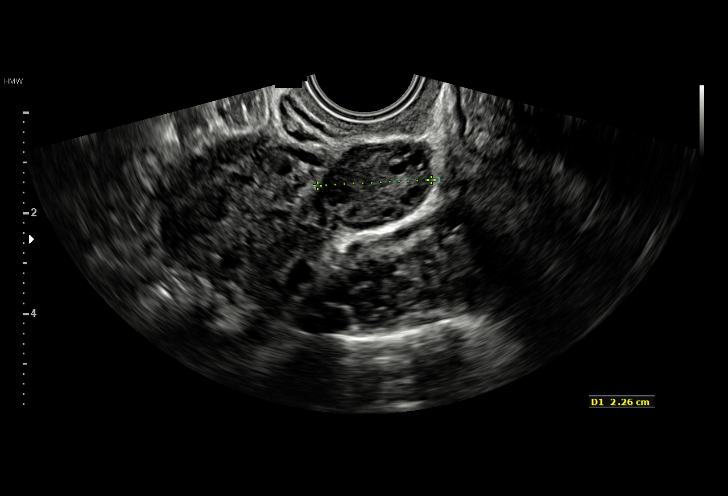
[im 27/35]
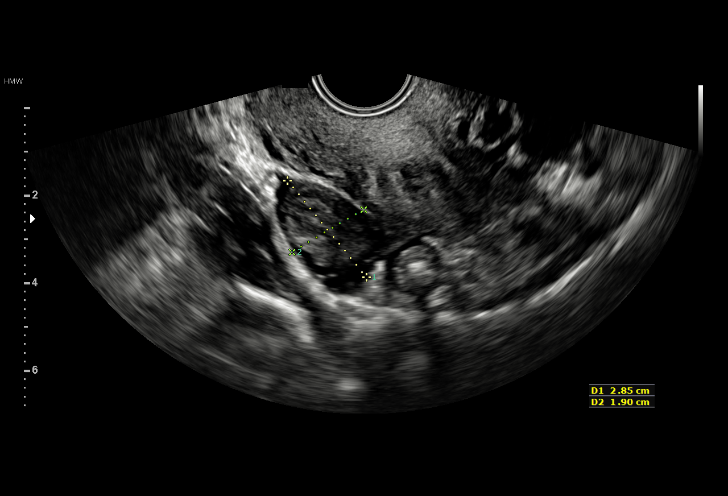
[im 29/35]
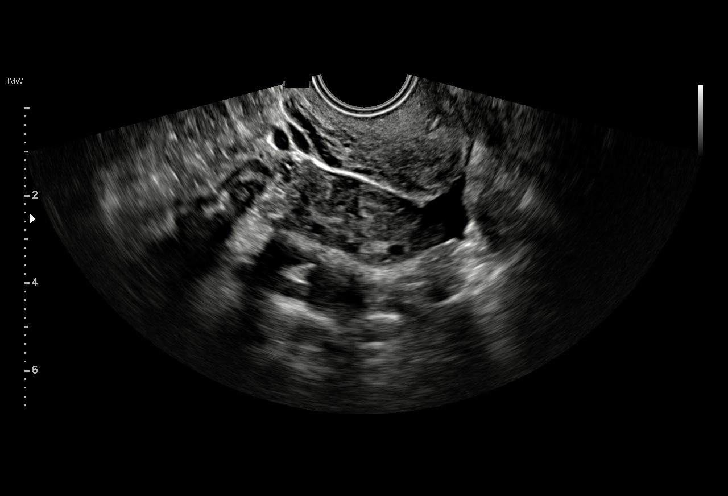
[im 32/35]
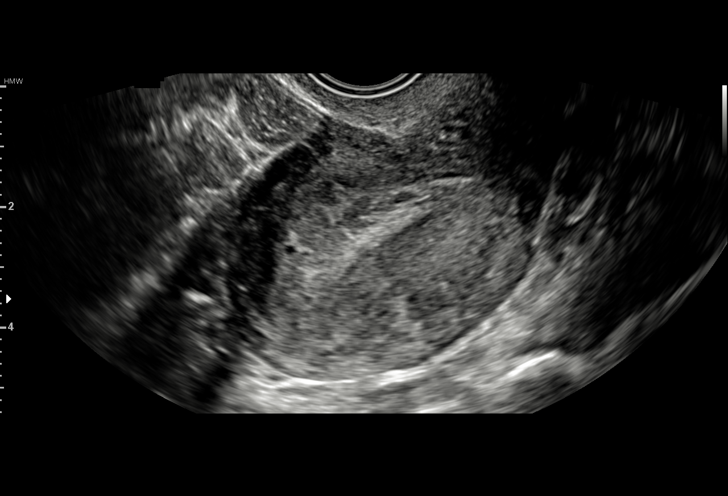
[im 35/35]
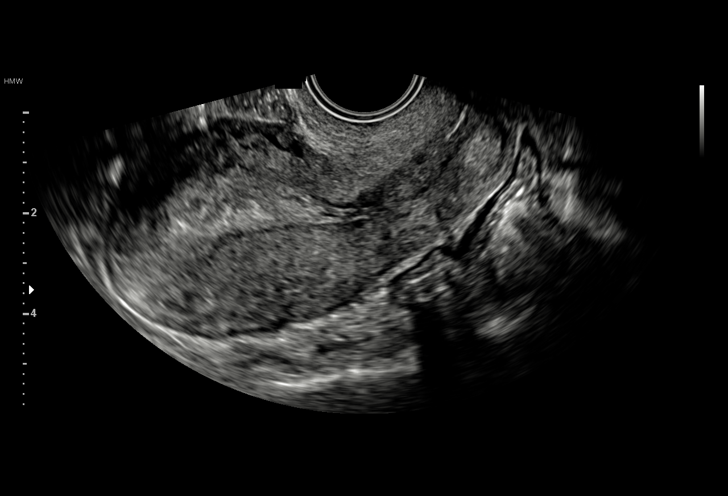

[15 of 28 positions shown; findings below may reference images not displayed]

FINDINGS: Intrauterine gestational sac: Tiny subtle lucency noted along the
fundal aspect.

Yolk sac:  Not Visualized.

Embryo:  Not Visualized.

Cardiac Activity: Not Visualized.

Heart Rate: Not applicable

MSD: 1.9  mm   too small to date

Subchorionic hemorrhage:  None visualized.

Maternal uterus/adnexae: The left ovary is unremarkable measuring
2.3 x 3.8 x 1.8 cm. The right ovary measures 2.9 x 1.9 x 3 cm in is
unremarkable. The uterus is anteverted without focal mass.
IMPRESSION: Tiny subtle lucency measuring 1.9 mm mean sac diameter along the
fundal portion. No definite double decidual reaction. As findings
might represent a very early intrauterine gestational sac, follow-up
quantitative B-HCG levels and follow-up US in 14 days to confirm and
assess viability is recommended. This recommendation follows SRU
consensus guidelines: N Engl J Med 9301; [DATE].
